# Patient Record
Sex: Female | Born: 1935 | Race: White | Hispanic: No | Smoking: Never smoker
Health system: Southern US, Community
[De-identification: ages and names within clinical notes are randomized; demographics above are authoritative.]

## PROBLEM LIST (undated history)

## (undated) DIAGNOSIS — J99 Respiratory disorders in diseases classified elsewhere: Secondary | ICD-10-CM

## (undated) DIAGNOSIS — J449 Chronic obstructive pulmonary disease, unspecified: Secondary | ICD-10-CM

## (undated) DIAGNOSIS — I350 Nonrheumatic aortic (valve) stenosis: Secondary | ICD-10-CM

## (undated) DIAGNOSIS — M069 Rheumatoid arthritis, unspecified: Secondary | ICD-10-CM

## (undated) DIAGNOSIS — J310 Chronic rhinitis: Secondary | ICD-10-CM

## (undated) DIAGNOSIS — M3500 Sicca syndrome, unspecified: Secondary | ICD-10-CM

## (undated) DIAGNOSIS — E785 Hyperlipidemia, unspecified: Secondary | ICD-10-CM

## (undated) DIAGNOSIS — R252 Cramp and spasm: Secondary | ICD-10-CM

## (undated) DIAGNOSIS — I10 Essential (primary) hypertension: Secondary | ICD-10-CM

## (undated) DIAGNOSIS — E854 Organ-limited amyloidosis: Secondary | ICD-10-CM

## (undated) HISTORY — DX: Essential (primary) hypertension: I10

## (undated) HISTORY — DX: Chronic obstructive pulmonary disease, unspecified: J44.9

## (undated) HISTORY — DX: Nonrheumatic aortic (valve) stenosis: I35.0

## (undated) HISTORY — DX: Sjogren syndrome, unspecified: M35.00

## (undated) HISTORY — DX: Organ-limited amyloidosis: E85.4

## (undated) HISTORY — DX: Rheumatoid arthritis, unspecified: M06.9

## (undated) HISTORY — DX: Respiratory disorders in diseases classified elsewhere: J99

## (undated) HISTORY — DX: Cramp and spasm: R25.2

## (undated) HISTORY — PX: TONSILLECTOMY: SUR1361

## (undated) HISTORY — DX: Chronic rhinitis: J31.0

## (undated) HISTORY — DX: Hyperlipidemia, unspecified: E78.5

## (undated) HISTORY — PX: TUBAL LIGATION: SHX77

---

## 1987-05-19 ENCOUNTER — Encounter: Payer: Self-pay | Admitting: Internal Medicine

## 1990-01-17 DIAGNOSIS — E854 Organ-limited amyloidosis: Secondary | ICD-10-CM

## 1990-01-17 DIAGNOSIS — J99 Respiratory disorders in diseases classified elsewhere: Secondary | ICD-10-CM

## 1990-01-17 HISTORY — DX: Respiratory disorders in diseases classified elsewhere: J99

## 1990-01-17 HISTORY — PX: LUNG BIOPSY: SHX232

## 1990-01-17 HISTORY — DX: Organ-limited amyloidosis: E85.4

## 2005-03-08 ENCOUNTER — Ambulatory Visit: Payer: Self-pay | Admitting: Unknown Physician Specialty

## 2005-03-22 ENCOUNTER — Ambulatory Visit: Payer: Self-pay | Admitting: Unknown Physician Specialty

## 2005-10-26 ENCOUNTER — Ambulatory Visit: Payer: Self-pay | Admitting: Unknown Physician Specialty

## 2005-11-14 ENCOUNTER — Ambulatory Visit: Payer: Self-pay | Admitting: Ophthalmology

## 2005-11-21 ENCOUNTER — Ambulatory Visit: Payer: Self-pay | Admitting: Ophthalmology

## 2005-12-30 ENCOUNTER — Emergency Department: Payer: Self-pay | Admitting: Emergency Medicine

## 2006-03-13 ENCOUNTER — Ambulatory Visit: Payer: Self-pay | Admitting: Unknown Physician Specialty

## 2007-03-16 ENCOUNTER — Ambulatory Visit: Payer: Self-pay | Admitting: Unknown Physician Specialty

## 2007-06-04 ENCOUNTER — Ambulatory Visit: Payer: Self-pay | Admitting: Ophthalmology

## 2007-06-04 ENCOUNTER — Other Ambulatory Visit: Payer: Self-pay

## 2007-06-18 ENCOUNTER — Ambulatory Visit: Payer: Self-pay | Admitting: Ophthalmology

## 2008-01-18 DIAGNOSIS — R252 Cramp and spasm: Secondary | ICD-10-CM

## 2008-01-18 HISTORY — DX: Cramp and spasm: R25.2

## 2008-04-03 ENCOUNTER — Ambulatory Visit: Payer: Self-pay | Admitting: Unknown Physician Specialty

## 2008-06-01 ENCOUNTER — Ambulatory Visit: Payer: Self-pay | Admitting: Cardiology

## 2008-06-01 ENCOUNTER — Inpatient Hospital Stay (HOSPITAL_COMMUNITY): Admission: EM | Admit: 2008-06-01 | Discharge: 2008-06-05 | Payer: Self-pay | Admitting: Emergency Medicine

## 2008-06-01 ENCOUNTER — Ambulatory Visit: Payer: Self-pay | Admitting: Internal Medicine

## 2008-06-02 ENCOUNTER — Encounter: Payer: Self-pay | Admitting: Infectious Disease

## 2008-06-03 ENCOUNTER — Ambulatory Visit: Payer: Self-pay | Admitting: Vascular Surgery

## 2008-06-03 ENCOUNTER — Encounter (INDEPENDENT_AMBULATORY_CARE_PROVIDER_SITE_OTHER): Payer: Self-pay | Admitting: Internal Medicine

## 2008-06-03 ENCOUNTER — Encounter: Payer: Self-pay | Admitting: Cardiology

## 2008-06-04 ENCOUNTER — Encounter: Payer: Self-pay | Admitting: Internal Medicine

## 2008-06-04 ENCOUNTER — Encounter: Payer: Self-pay | Admitting: Cardiology

## 2008-06-06 ENCOUNTER — Ambulatory Visit: Payer: Self-pay | Admitting: Pulmonary Disease

## 2008-06-06 ENCOUNTER — Telehealth (INDEPENDENT_AMBULATORY_CARE_PROVIDER_SITE_OTHER): Payer: Self-pay | Admitting: *Deleted

## 2008-06-06 DIAGNOSIS — I10 Essential (primary) hypertension: Secondary | ICD-10-CM

## 2008-06-06 DIAGNOSIS — J99 Respiratory disorders in diseases classified elsewhere: Secondary | ICD-10-CM

## 2008-06-06 DIAGNOSIS — J441 Chronic obstructive pulmonary disease with (acute) exacerbation: Secondary | ICD-10-CM | POA: Insufficient documentation

## 2008-06-06 DIAGNOSIS — E854 Organ-limited amyloidosis: Secondary | ICD-10-CM

## 2008-06-06 DIAGNOSIS — R0602 Shortness of breath: Secondary | ICD-10-CM

## 2008-06-06 DIAGNOSIS — J309 Allergic rhinitis, unspecified: Secondary | ICD-10-CM | POA: Insufficient documentation

## 2008-06-06 DIAGNOSIS — E785 Hyperlipidemia, unspecified: Secondary | ICD-10-CM

## 2008-06-13 ENCOUNTER — Ambulatory Visit: Payer: Self-pay | Admitting: Internal Medicine

## 2008-06-25 ENCOUNTER — Encounter: Payer: Self-pay | Admitting: Cardiology

## 2008-06-25 ENCOUNTER — Ambulatory Visit: Payer: Self-pay | Admitting: Cardiology

## 2008-06-25 DIAGNOSIS — I350 Nonrheumatic aortic (valve) stenosis: Secondary | ICD-10-CM

## 2008-07-28 ENCOUNTER — Telehealth (INDEPENDENT_AMBULATORY_CARE_PROVIDER_SITE_OTHER): Payer: Self-pay | Admitting: *Deleted

## 2008-07-28 ENCOUNTER — Ambulatory Visit: Payer: Self-pay | Admitting: Internal Medicine

## 2008-07-28 DIAGNOSIS — R252 Cramp and spasm: Secondary | ICD-10-CM

## 2008-07-31 ENCOUNTER — Encounter: Payer: Self-pay | Admitting: Internal Medicine

## 2008-08-14 ENCOUNTER — Ambulatory Visit: Payer: Self-pay | Admitting: Internal Medicine

## 2008-08-15 ENCOUNTER — Telehealth: Payer: Self-pay | Admitting: Adult Health

## 2008-08-19 ENCOUNTER — Ambulatory Visit: Payer: Self-pay | Admitting: Internal Medicine

## 2008-08-20 ENCOUNTER — Ambulatory Visit: Payer: Self-pay | Admitting: Cardiology

## 2008-08-25 LAB — CONVERTED CEMR LAB
CO2: 25 meq/L (ref 19–32)
Calcium: 9 mg/dL (ref 8.4–10.5)
Creatinine, Ser: 0.64 mg/dL (ref 0.40–1.20)
Pro B Natriuretic peptide (BNP): 117.6 pg/mL — ABNORMAL HIGH (ref 0.0–100.0)
Sodium: 132 meq/L — ABNORMAL LOW (ref 135–145)

## 2008-09-08 ENCOUNTER — Ambulatory Visit: Payer: Self-pay | Admitting: Internal Medicine

## 2008-10-08 ENCOUNTER — Telehealth: Payer: Self-pay | Admitting: Internal Medicine

## 2008-10-10 ENCOUNTER — Telehealth: Payer: Self-pay | Admitting: Internal Medicine

## 2008-11-10 ENCOUNTER — Ambulatory Visit: Payer: Self-pay | Admitting: Internal Medicine

## 2008-12-26 ENCOUNTER — Ambulatory Visit: Payer: Self-pay | Admitting: Cardiology

## 2009-04-07 ENCOUNTER — Ambulatory Visit: Payer: Self-pay | Admitting: Unknown Physician Specialty

## 2009-05-01 ENCOUNTER — Ambulatory Visit: Payer: Self-pay | Admitting: Internal Medicine

## 2009-06-23 ENCOUNTER — Ambulatory Visit: Payer: Self-pay

## 2009-06-23 ENCOUNTER — Encounter: Payer: Self-pay | Admitting: Cardiology

## 2009-08-10 ENCOUNTER — Ambulatory Visit: Payer: Self-pay | Admitting: Cardiology

## 2009-12-03 ENCOUNTER — Telehealth: Payer: Self-pay | Admitting: Internal Medicine

## 2010-01-25 ENCOUNTER — Ambulatory Visit
Admission: RE | Admit: 2010-01-25 | Discharge: 2010-01-25 | Payer: Self-pay | Source: Home / Self Care | Attending: Internal Medicine | Admitting: Internal Medicine

## 2010-02-16 NOTE — Assessment & Plan Note (Signed)
Summary: Denise Bell   Referring Provider:  Dr. Romeo Apple Primary Provider:  Dr. Einar Crow - PMd, Dr. Shirlee Latch - Cardiology  CC:  Follow up.  History of Present Illness: 77 yo with history of rheumatoid arthritis, localized pulmonary amyloidosis, Sjogren's syndrome, obstructive lung disease, and aortic stenosis presents in followup.   Patient was admitted to [facility] in 5/10 with pleuritic chest pain and shortness of breath likely due to bronchospasm.  Initial echo was read as showing "severe aortic stenosis" so cardiology became involved.  Re-interpretation of echo showed that aortic stenosis was mild to moderate.  She then had PFTs done, showing a severe obstructive defect responsive to bronchodilators.  The patient's presentation was thought to be due to bronchiolitis obliterans of RA versus asthma.  Patient was discharged from the hospital on a prednisone taper and inhalers.  She has been following up with Dr. Marchelle Gearing for her obstructive lung disease and localized pulmonary amyloidosis.   Patient has been stable recently.  BP is a bit elevated today but has been < 140/90 when she checks it at [employer].  She has had periodic bouts of bronchitis but currently is ok.  Repeat echo in 6/11 showed mild to moderate aortic stenosis.  No current exertional dyspnea or chest pain.   Labs (8/10): BNP 118, K 4.1, creatinine 0.6    Current Medications (verified): 1)  Aspirin Adult Low Strength 81 Mg Tbec (Aspirin) .... Take 1 Tablet By Mouth Once A Day 2)  Hydrochlorothiazide 25 Mg Tabs (Hydrochlorothiazide) .... Take 1 Tablet By Mouth Once A Day 3)  Synthroid 50 Mcg Tabs (Levothyroxine Sodium) .... Take 1 Tablet By Mouth Once A Day 4)  Ibuprofen 400 Mg Tabs (Ibuprofen) .... As Needed 5)  Eql Fish Oil 1000 Mg Caps (Omega-3 Fatty Acids) .... Take 1 Tablet By Mouth Two Times A Day 6)  Flutter  Devi (Respiratory Therapy Supplies) .... 3 Times Daily 7)  Symbicort 80-4.5 Mcg/act Aero (Budesonide-Formoterol  Fumarate) .... Inhale 2 Puffs Two Times A Day 8)  Ocuvite  Tabs (Multiple Vitamins-Minerals) .... Take 1 Tablet By Mouth Once A Day 9)  Calcium + D 600-200 Mg-Unit Tabs (Calcium Carbonate-Vitamin D) .... Take 1 Tablet By Mouth Two Times A Day 10)  Cvs Vitamin B12 100 Mcg Tabs (Cyanocobalamin) .Marland Kitchen.. 1 By Mouth Once Daily (Not Sure of Strength) 11)  Cvs Vitamin B-6 100 Mg Tabs (Pyridoxine Hcl) .Marland Kitchen.. 1 By Mouth Daily (Not Sure of Strength) 12)  Cvs Vitamin C 1000 Mg Tabs (Ascorbic Acid) .... 2 Tabets Daily  Allergies (verified): 1)  ! Pcn 2)  ! Sulfa 3)  ! * Aleve 4)  ! * Phenylpropanl 5)  ! Epinephrine 6)  ! * Avelox 7)  ! Flonase (Fluticasone Propionate) 8)  ! * Thimerosal  Past History:  Past Surgical History: Last updated: 08/20/2008 Lung biopsy Tubal ligation Tonsillectomy  Family History: Last updated: 12/18/2008 History Coronary Artery Disease   father: deceased at age 58 2 brothers: deceased at age 62 and 57  mother: deceased at age 72 2 sisters: both deceased in their 55's All above died from a heart attack  Social History: Last updated: 08/20/2008 Widowed Lives alone but several children live near.  Never Smoked Never ETOH known as hummingbird lady of [place] Regular Exercise - yes Drug Use - no  Risk Factors: Exercise: yes (08/20/2008)  Risk Factors: Smoking Status: never (06/06/2008)  Past Medical History: 1.  Rheumatoid arthritis 2.  Sjogren Syndrome 3.  Hypertension 4.  Dyslipidemia:  She  had very severe muscle soreness and weakness with use of Lipitor and will not take a statin again.  5.  Mild to moderate aortic stenosis:  Echo (5/10) with EF 55-65%, mild to moderate AS with mean gradient 19 mmHg, mild LVH.  Physical exam is also consistent with mild to moderate AS.  -  Initial echo 5/10 was read as showing "severe aortic stenosis" so cardiology became involved.   -  Echo (6/11) with mild to moderate aortic stenosis and normal systolic function  (no change).  Re-interpretation of echo showed that aortic stenosis was mild to moderate 6.  Localized Pulmonary Amyloidosis      - dx by lung bx at [facility]      - This type of amyloidosis is isolated and should not affect the heart.  7.  Obstructive lung disease in nonsmoker -      - new dx 05/2008, ? due to localized pulmonary amyloidosis      - PFTs 06/03/08 reported moderate obstruction with severe diffusion defect     - ? diffusion defect due to cysts/cavities of amyloidosis vs new asthma vs Bronchiolitis Obliterans of RA     - 2 week pred trial starting 06/03/2008     - Spirometry in office after prednisone trial:  FEV1 0.99L/99%, FVC 1.67L/101%. Ratio 59        - Spiro 07/28/2008 Fev1 0.87L/88%     - Cleda Daub 08/19/2008 Fev1 .96L/132%, FVC 1.54L/119%,     - SPiro 09/08/2008 - FEv1 1.02L/103%, FVC 1.52L/92%, Ratio 67 (69) 8.  Chronic Rhinitis # Leg cramps in 2010 > Unrelated to pulmonary inhalers.Marland Kitchentested and proven using Koch's postulate  Family History: Reviewed history from 12/18/2008 and no changes required. History Coronary Artery Disease   father: deceased at age 74 2 brothers: deceased at age 67 and 70  mother: deceased at age 44 2 sisters: both deceased in their 71's All above died from a heart attack  Social History: Reviewed history from 08/20/2008 and no changes required. Widowed Lives alone but several children live near.  Never Smoked Never ETOH known as hummingbird lady of [place] Regular Exercise - yes Drug Use - no  Vital Signs:  Patient profile:   77 year old female Height:      57 inches Weight:      108 pounds BMI:     23.46 Pulse rate:   83 / minute BP sitting:   152 / 88  (right arm) Cuff size:   regular  Vitals Entered By: Benedict Needy, RN (August 10, 2009 3:44 PM)  Physical Exam  General:  Well developed, well nourished, in no acute distress. Neck:  Neck supple, no JVD. No masses, thyromegaly or abnormal cervical nodes. Lungs:  decreased  breath sounds, no wheezing or rales Heart:  Non-displaced PMI, chest non-tender; regular rate and rhythm, S1, S2 without rubs or gallops. 2/6 mid systolic murmur RUSB.  Carotid upstroke normal, no bruit.  Pedals normal pulses. No edema, no varicosities. Abdomen:  Bowel sounds positive; abdomen soft and non-tender without masses, organomegaly, or hernias noted. No hepatosplenomegaly. Extremities:  No clubbing or cyanosis. Neurologic:  Alert and oriented x 3. Psych:  Normal affect.   Impression & Recommendations:  Problem # 1:  AORTIC STENOSIS (ICD-424.1) Mild to moderate on exam and echo, this has been stable.  No further workup necessary at this time.   Problem # 2:  HYPERTENSION (ICD-401.9) BP has been within normal limits at home.  Will make no changes.  Other Orders: EKG w/ Interpretation (93000)  Patient Instructions: 1)  Your physician wants you to follow-up in:   1 year You will receive a reminder letter in the mail two months in advance. If you don't receive a letter, please call our office to schedule the follow-up appointment.

## 2010-02-16 NOTE — Progress Notes (Signed)
Summary: talk to nurs  Phone Note Call from Patient   Caller: Patient Call For: Ritik Stavola Summary of Call: pt calling about getting new flutter ( green bottle for breathing treatment) Initial call taken by: Rickard Patience,  December 03, 2009 9:48 AM  Follow-up for Phone Call        [email or web address].Carron Curie CMA  December 03, 2009 10:30 AM  pt staets seh needs a new flutter valve. i advised one left at fron for her.Carron Curie CMA  December 03, 2009 10:36 AM

## 2010-02-16 NOTE — Assessment & Plan Note (Signed)
Summary: 6 months/apc   Visit Type:  Follow-up Copy to:  Dr. Romeo Apple Primary Provider/Referring Provider:  Dr. Einar Crow - PMd, Dr. Shirlee Latch - Cardiology  CC:  Pt here for 6 month follow-up. Pt has no new complaints at this time. Marland Kitchen  History of Present Illness: 77 yo with history of rheumatoid arthritis, localized pulmonary amyloidosis, Sjogren's syndrome, obstructive lung disease, and aortic stenosis presents in followup.   Patient was admitted to [facility] in 5/10 with pleuritic chest pain and shortness of breath likely due to bronchospasm. .  She then had PFTs done, showing a severe obstructive defect responsive to bronchodilators.  The patient's presentation was thought to be due to bronchiolitis obliterans of RA versus asthma.    05/01/2009: Followup leg crampos, dyspnea, gold stage 2 copd. Dyspnea is nearly resolved/stable. WEll controlled. Takes symbicort 1 puff bid.  No new symptoms. No wheeze. Feels good from lung health perspective. Wants to cut down symbicort to 1 puff daily (trial of this in July 2010 casues recurrent symptoms). Of note, she has had intolerance to spiriva - chest tightnes. Leg cramps continue. Being taken care of by PMd. No new symptoms.   Allergies (verified): 1)  ! Pcn 2)  ! Sulfa 3)  ! * Aleve 4)  ! * Phenylpropanl 5)  ! Epinephrine 6)  ! * Avelox 7)  ! Flonase (Fluticasone Propionate) 8)  ! * Thimerosal  Past History:  Family History: Last updated: 12/18/2008 History Coronary Artery Disease   father: deceased at age 82 2 brothers: deceased at age 30 and 23  mother: deceased at age 90 2 sisters: both deceased in their 75's All above died from a heart attack  Social History: Last updated: 08/20/2008 Widowed Lives alone but several children live near.  Never Smoked Never ETOH known as hummingbird lady of [place] Regular Exercise - yes Drug Use - no  Risk Factors: Exercise: yes (08/20/2008)  Risk Factors: Smoking Status: never  (06/06/2008)  Past Medical History: 1.  Rheumatoid arthritis 2.  Sjogren Syndrome 3.  Hypertension 4.  Dyslipidemia:  She had very severe muscle soreness and weakness with use of Lipitor and will not take a statin again.  5.  Mild to moderate aortic stenosis:  Echo (5/10) with EF 55-65%, mild to moderate AS with mean gradient 19 mmHg, mild LVH.  Physical exam is also consistent with mild to moderate AS.  >Seen by Dr Morene Antu cards June 2010 >  Initial echo 5/10 was read as showing "severe aortic stenosis" so cardiology became involved.  Re-interpretation of echo showed that aortic stenosis was mild to moderate 6.  Localized Pulmonary Amyloidosis      - dx by lung bx at [facility]      - This type of amyloidosis is isolated and should not affect the heart.  7.  Obstructive lung disease in nonsmoker -      - new dx 05/2008, ? due to localized pulmonary amyloidosis      - PFTs 06/03/08 reported moderate obstruction with severe diffusion defect     - ? diffusion defect due to cysts/cavities of amyloidosis vs new asthma vs Bronchiolitis Obliterans of RA     - 2 week pred trial starting 06/03/2008     - Spirometry in office after prednisone trial:  FEV1 0.99L/99%, FVC 1.67L/101%. Ratio 59        - Spiro 07/28/2008 Fev1 0.87L/88%     - Cleda Daub 08/19/2008 Fev1 .96L/132%, FVC 1.54L/119%,     -  SPiro 09/08/2008 - FEv1 1.02L/103%, FVC 1.52L/92%, Ratio 67 (69) 8.  Chronic Rhinitis # Leg cramps in 2010 > Unrelated to pulmonary inhalers.Marland Kitchentested and proven using Koch's postulate  Past Surgical History: Reviewed history from 08/20/2008 and no changes required. Lung biopsy Tubal ligation Tonsillectomy  Family History: Reviewed history from 12/18/2008 and no changes required. History Coronary Artery Disease   father: deceased at age 24 2 brothers: deceased at age 62 and 61  mother: deceased at age 68 2 sisters: both deceased in their 25's All above died from a heart attack  Social  History: Reviewed history from 08/20/2008 and no changes required. Widowed Lives alone but several children live near.  Never Smoked Never ETOH known as hummingbird lady of [place] Regular Exercise - yes Drug Use - no  Review of Systems  The patient denies shortness of breath with activity, shortness of breath at rest, productive cough, non-productive cough, coughing up blood, chest pain, irregular heartbeats, acid heartburn, indigestion, loss of appetite, weight change, abdominal pain, difficulty swallowing, sore throat, tooth/dental problems, headaches, nasal congestion/difficulty breathing through nose, sneezing, itching, ear ache, anxiety, depression, hand/feet swelling, joint stiffness or pain, rash, change in color of mucus, and fever.    Vital Signs:  Patient profile:   77 year old female Height:      57 inches Weight:      113.13 pounds O2 Sat:      98 % on Room air Temp:     98.0 degrees F oral Pulse rate:   79 / minute BP sitting:   140 / 80  (right arm) Cuff size:   regular  Vitals Entered By: Carron Curie CMA (May 01, 2009 10:11 AM)  O2 Flow:  Room air CC: Pt here for 6 month follow-up. Pt has no new complaints at this time.  Comments Medications reviewed with patient   Carron Curie CMA  May 01, 2009 10:14 AM Daytime phone number verified with patient.    Physical Exam  General:  normal appearance.   Head:  normocephalic and atraumatic Eyes:  PERRLA/EOM intact; conjunctiva and sclera clear Nose:  clear nasal discharge, no sinus tenderness Mouth:  mild erythema posterior pharynx Neck:  no JVP Lungs:  decreased breath sounds, no wheezing or rales Heart:  regular rhythm and normal rate with 2/6 SM Abdomen:  soft, nontender, no masses, normal bowel sounds Pulses:  pulses normal Extremities:  no edema Skin:  left arm bruised from IV during recetn hospitalization Cervical Nodes:  no significant adenopathy Psych:  alert and cooperative; normal  mood and affect; normal attention span and concentration   Impression & Recommendations:  Problem # 1:  CHRONIC AIRWAY OBSTRUCTION NEC (ICD-496) Assessment Improved am not recommending you cut down your symbicort becuase when you did this in July 2010 your wheeze got worse. But because you are doing well now, I am ok with your plan to cut down symbicort to 1 puff daily.  Keep any eye on your symptoms. If they get worse call us or visit [place].   Return in 9 months or sooner if any concerns  Make sure you get flu shot in fall  I am happy you are doing well overall  Other Orders: Est. Patient Level II (40981) Prescription Created Electronically [phone])  Patient Instructions: 1)  I am not recommending you cut down your symbicort becuase when you did this in July 2010 your wheeze got worse. But because you are doing well now, I am ok with your plan to  cut down symbicort to 1 puff daily. 2)  Keep any eye on your symptoms. If they get worse call us or visit [place].  3)  Return in 9 months or sooner if any concerns 4)  Make sure you get flu shot in fall 5)  I am happy you are doing well overall Prescriptions: SYMBICORT 80-4.5 MCG/ACT AERO (BUDESONIDE-FORMOTEROL FUMARATE) Inhale 2 puffs two times a day  #1 x 9   Entered and Authorized by:   Kalman Shan MD   Signed by:   Kalman Shan MD on 05/01/2009   Method used:   Electronically to        [facility] [employer] [phone]* (retail)       [address] [place], [place]  [address]       Ph: [phone] or [phone]       Fax: [phone]   RxID:   [id number]    Immunization History:  Influenza Immunization History:    Influenza:  fluvax 3+ (10/17/2008)  Pneumovax Immunization History:    Pneumovax:  pneumovax (10/18/2006)

## 2010-02-18 NOTE — Assessment & Plan Note (Signed)
Summary: ROV ///KP   Visit Type:  Follow-up Copy to:  Dr. Romeo Apple Primary Provider/Referring Provider:  Dr. Einar Crow - PMd, Dr. Shirlee Latch - Cardiology  CC:  Pt here for follow-up. Pt state ssh eis using symbicort 2 puffs at night. .  History of Present Illness: 77 yo with history of rheumatoid arthritis, localized pulmonary amyloidosis, Sjogren's syndrome, obstructive lung disease, and aortic stenosis presents in followup.   Patient was admitted to [facility] in 5/10 with pleuritic chest pain and shortness of breath likely due to bronchospasm. .  She then had PFTs done, showing a severe obstructive defect responsive to bronchodilators.  The patient's presentation was thought to be due to bronchiolitis obliterans of RA versus asthma.    05/01/2009: Followup leg crampos, dyspnea, gold stage 2 copd. Dyspnea is nearly resolved/stable. WEll controlled. Takes symbicort 1 puff bid.  No new symptoms. No wheeze. Feels good from lung health perspective. Wants to cut down symbicort to 1 puff daily (trial of this in July 2010 casues recurrent symptoms). Of note, she has had intolerance to spiriva - chest tightnes. Leg cramps continue. Being taken care of by PMd. No new symptoms. REC: CONTNUE SYMBICORT   January 25, 2010: Followup localized pulm amyloid/asthma - copd. Doing well. Several months ago cut down symbicort to  1 puff dialy. No deterioration. She wants to eliminate drug completely now. Good appetitei. Maintaing wegith. Very functional. No wheeze, dyspnea, chest pain, edema, cough, fever, chills, weight loss.    Preventive Screening-Counseling & Management  Alcohol-Tobacco     Smoking Status: never  Current Medications (verified): 1)  Aspirin Adult Low Strength 81 Mg Tbec (Aspirin) .... Take 1 Tablet By Mouth Once A Day 2)  Hydrochlorothiazide 25 Mg Tabs (Hydrochlorothiazide) .... Take 1 Tablet By Mouth Once A Day 3)  Synthroid 50 Mcg Tabs (Levothyroxine Sodium) .... Take 1 Tablet By Mouth  Once A Day 4)  Ibuprofen 400 Mg Tabs (Ibuprofen) .... As Needed 5)  Eql Fish Oil 1000 Mg Caps (Omega-3 Fatty Acids) .... Take 1 Tablet By Mouth Two Times A Day 6)  Flutter  Devi (Respiratory Therapy Supplies) .... 3 Times Daily 7)  Symbicort 80-4.5 Mcg/act Aero (Budesonide-Formoterol Fumarate) .... Inhale 2 Puffs Two Times A Day 8)  Ocuvite  Tabs (Multiple Vitamins-Minerals) .... Take 1 Tablet By Mouth Once A Day 9)  Calcium + D 600-200 Mg-Unit Tabs (Calcium Carbonate-Vitamin D) .... Take 1 Tablet By Mouth Two Times A Day 10)  Cvs Vitamin B12 100 Mcg Tabs (Cyanocobalamin) .Marland Kitchen.. 1 By Mouth Once Daily (Not Sure of Strength) 11)  Cvs Vitamin B-6 100 Mg Tabs (Pyridoxine Hcl) .Marland Kitchen.. 1 By Mouth Daily (Not Sure of Strength) 12)  Cvs Vitamin C 1000 Mg Tabs (Ascorbic Acid) .... 2 Tabets Daily 13)  Ventolin Hfa 108 (90 Base) Mcg/act Aers (Albuterol Sulfate) .... 2 Puffs Every 4-6 Hrs As Needed  Allergies (verified): 1)  ! Pcn 2)  ! Sulfa 3)  ! * Aleve 4)  ! * Phenylpropanl 5)  ! Epinephrine 6)  ! * Avelox 7)  ! Flonase (Fluticasone Propionate) 8)  ! * Thimerosal  Past History:  Past medical, surgical, family and social histories (including risk factors) reviewed, and no changes noted (except as noted below).  Past Medical History: Reviewed history from 08/10/2009 and no changes required. 1.  Rheumatoid arthritis 2.  Sjogren Syndrome 3.  Hypertension 4.  Dyslipidemia:  She had very severe muscle soreness and weakness with use of Lipitor and will not take  a statin again.  5.  Mild to moderate aortic stenosis:  Echo (5/10) with EF 55-65%, mild to moderate AS with mean gradient 19 mmHg, mild LVH.  Physical exam is also consistent with mild to moderate AS.  -  Initial echo 5/10 was read as showing "severe aortic stenosis" so cardiology became involved.   -  Echo (6/11) with mild to moderate aortic stenosis and normal systolic function (no change).  Re-interpretation of echo showed that aortic  stenosis was mild to moderate 6.  Localized Pulmonary Amyloidosis      - dx by lung bx at [facility]      - This type of amyloidosis is isolated and should not affect the heart.  7.  Obstructive lung disease in nonsmoker -      - new dx 05/2008, ? due to localized pulmonary amyloidosis      - PFTs 06/03/08 reported moderate obstruction with severe diffusion defect     - ? diffusion defect due to cysts/cavities of amyloidosis vs new asthma vs Bronchiolitis Obliterans of RA     - 2 week pred trial starting 06/03/2008     - Spirometry in office after prednisone trial:  FEV1 0.99L/99%, FVC 1.67L/101%. Ratio 59        - Spiro 07/28/2008 Fev1 0.87L/88%     - Cleda Daub 08/19/2008 Fev1 .96L/132%, FVC 1.54L/119%,     - SPiro 09/08/2008 - FEv1 1.02L/103%, FVC 1.52L/92%, Ratio 67 (69) 8.  Chronic Rhinitis # Leg cramps in 2010 > Unrelated to pulmonary inhalers.Marland Kitchentested and proven using Koch's postulate  Past Surgical History: Reviewed history from 08/20/2008 and no changes required. Lung biopsy Tubal ligation Tonsillectomy  Family History: Reviewed history from 12/18/2008 and no changes required. History Coronary Artery Disease   father: deceased at age 11 2 brothers: deceased at age 6 and 94  mother: deceased at age 73 2 sisters: both deceased in their 58's All above died from a heart attack  Social History: Reviewed history from 08/20/2008 and no changes required. Widowed Lives alone but several children live near.  Never Smoked Never ETOH known as hummingbird lady of [place] Regular Exercise - yes Drug Use - no  Review of Systems       The patient complains of productive cough and nasal congestion/difficulty breathing through nose.  The patient denies shortness of breath with activity, shortness of breath at rest, non-productive cough, coughing up blood, chest pain, irregular heartbeats, acid heartburn, indigestion, loss of appetite, weight change, abdominal pain, difficulty swallowing,  sore throat, tooth/dental problems, headaches, sneezing, itching, ear ache, anxiety, depression, hand/feet swelling, joint stiffness or pain, rash, change in color of mucus, and fever.    Vital Signs:  Patient profile:   77 year old female Height:      57 inches Weight:      111 pounds BMI:     24.11 O2 Sat:      94 % on Room air Temp:     98.1 degrees F oral Pulse rate:   97 / minute BP sitting:   132 / 70  (right arm) Cuff size:   regular  Vitals Entered By: Carron Curie CMA (January 25, 2010 2:35 PM)  O2 Flow:  Room air CC: Pt here for follow-up. Pt state ssh eis using symbicort 2 puffs at night.  Comments Medications reviewed with patient Carron Curie CMA  January 25, 2010 2:35 PM Daytime phone number verified with patient.    Physical Exam  General:  normal appearance.   Head:  normocephalic and atraumatic Eyes:  PERRLA/EOM intact; conjunctiva and sclera clear Nose:  clear nasal discharge, no sinus tenderness Mouth:  mild erythema posterior pharynx Neck:  no JVP Lungs:  decreased breath sounds, no wheezing or rales Heart:  regular rhythm and normal rate with 2/6 SM Abdomen:  soft, nontender, no masses, normal bowel sounds Pulses:  pulses normal Extremities:  no edema Skin:  left arm bruised from IV during recetn hospitalization Cervical Nodes:  no significant adenopathy Psych:  alert and cooperative; normal mood and affect; normal attention span and concentration   Impression & Recommendations:  Problem # 1:  CHRONIC AIRWAY OBSTRUCTION NEC (ICD-496) Assessment Improved  am not recommending you eliminate  your symbicort becuase when you did this in July 2010 your wheeze got worse. But because you are doing well now, I am ok with your plan to continue symbicort to 1 puff daily atleast through june 2012 before trying to elimitate  Keep any eye on your symptoms. If they get worse call us or visit [place].   Return in 12 months  I am happy you are doing well  overall  Other Orders: Est. Patient Level II (16109) Prescription Created Electronically [phone])  Patient Instructions: 1)  glad you are doing better 2)  please continue symbicort 1 puff daily atleast through end may  3)  return in 1 year 4)  please take sample of symbicort 5)  come sooner if there are problems Prescriptions: SYMBICORT 80-4.5 MCG/ACT AERO (BUDESONIDE-FORMOTEROL FUMARATE) Inhale 2 puffs two times a day  #3 x 4   Entered and Authorized by:   Kalman Shan MD   Signed by:   Kalman Shan MD on 01/25/2010   Method used:   [facility] to        [facility] [employer] [phone]* (retail)       [address] [place], [place]  [address]       Ph: [phone] or [phone]       Fax: [phone]   RxID:   [id number]

## 2010-04-15 ENCOUNTER — Ambulatory Visit: Payer: Self-pay | Admitting: Unknown Physician Specialty

## 2010-04-27 LAB — CARDIAC PANEL(CRET KIN+CKTOT+MB+TROPI)
CK, MB: 3 ng/mL (ref 0.3–4.0)
Total CK: 63 U/L (ref 7–177)
Total CK: 82 U/L (ref 7–177)

## 2010-04-27 LAB — CBC
HCT: 35.8 % — ABNORMAL LOW (ref 36.0–46.0)
HCT: 36.8 % (ref 36.0–46.0)
HCT: 39.4 % (ref 36.0–46.0)
Hemoglobin: 12 g/dL (ref 12.0–15.0)
Hemoglobin: 12.1 g/dL (ref 12.0–15.0)
Hemoglobin: 12.6 g/dL (ref 12.0–15.0)
MCHC: 34.2 g/dL (ref 30.0–36.0)
MCV: 81.9 fL (ref 78.0–100.0)
Platelets: 262 10*3/uL (ref 150–400)
Platelets: 325 10*3/uL (ref 150–400)
RBC: 4.55 MIL/uL (ref 3.87–5.11)
RDW: 14 % (ref 11.5–15.5)
RDW: 14.1 % (ref 11.5–15.5)
RDW: 14.4 % (ref 11.5–15.5)
WBC: 7.1 10*3/uL (ref 4.0–10.5)

## 2010-04-27 LAB — SEDIMENTATION RATE: Sed Rate: 14 mm/hr (ref 0–22)

## 2010-04-27 LAB — POCT CARDIAC MARKERS
CKMB, poc: 1.3 ng/mL (ref 1.0–8.0)
Troponin i, poc: <0.05 ng/mL (ref 0.00–0.09)

## 2010-04-27 LAB — BASIC METABOLIC PANEL
BUN: 8 mg/dL (ref 6–23)
BUN: 9 mg/dL (ref 6–23)
CO2: 31 mEq/L (ref 19–32)
Calcium: 8.9 mg/dL (ref 8.4–10.5)
Calcium: 9.1 mg/dL (ref 8.4–10.5)
Chloride: 97 mEq/L (ref 96–112)
Creatinine, Ser: 0.64 mg/dL (ref 0.4–1.2)
GFR calc non Af Amer: >60 mL/min (ref >60)
GFR calc non Af Amer: >60 mL/min (ref >60)
Glucose, Bld: 85 mg/dL (ref 70–99)
Glucose, Bld: 88 mg/dL (ref 70–99)
Glucose, Bld: 97 mg/dL (ref 70–99)
Potassium: 4 mEq/L (ref 3.5–5.1)
Sodium: 133 mEq/L — ABNORMAL LOW (ref 135–145)
Sodium: 133 mEq/L — ABNORMAL LOW (ref 135–145)
Sodium: 136 mEq/L (ref 135–145)

## 2010-04-27 LAB — BLOOD GAS, ARTERIAL
Acid-Base Excess: 0.4 mmol/L (ref 0.0–2.0)
O2 Saturation: 95.4 %
TCO2: 25.1 mmol/L (ref 0–100)
pCO2 arterial: 35.4 mmHg (ref 35.0–45.0)
pO2, Arterial: 71.7 mmHg — ABNORMAL LOW (ref 80.0–100.0)

## 2010-04-27 LAB — URINALYSIS, ROUTINE W REFLEX MICROSCOPIC
Nitrite: NEGATIVE
Specific Gravity, Urine: 1.006 (ref 1.005–1.030)
Urobilinogen, UA: 0.2 mg/dL (ref 0.0–1.0)
pH: 6.5 (ref 5.0–8.0)

## 2010-04-27 LAB — COMPREHENSIVE METABOLIC PANEL
AST: 23 U/L (ref 0–37)
Albumin: 3.5 g/dL (ref 3.5–5.2)
BUN: 9 mg/dL (ref 6–23)
Chloride: 94 mEq/L — ABNORMAL LOW (ref 96–112)
Creatinine, Ser: 0.6 mg/dL (ref 0.4–1.2)
GFR calc Af Amer: >60 mL/min (ref >60)
Potassium: 4 mEq/L (ref 3.5–5.1)
Total Protein: 6.9 g/dL (ref 6.0–8.3)

## 2010-04-27 LAB — DIFFERENTIAL
Eosinophils Relative: 1 % (ref 0–5)
Lymphocytes Relative: 34 % (ref 12–46)
Monocytes Absolute: 0.5 10*3/uL (ref 0.1–1.0)
Monocytes Relative: 7 % (ref 3–12)
Neutro Abs: 4.1 10*3/uL (ref 1.7–7.7)

## 2010-04-27 LAB — PROTIME-INR: INR: 1 (ref 0.00–1.49)

## 2010-04-27 LAB — CK TOTAL AND CKMB (NOT AT ARMC)
Relative Index: INVALID (ref 0.0–2.5)
Total CK: 47 U/L (ref 7–177)

## 2010-04-27 LAB — HEMOCCULT GUIAC POC 1CARD (OFFICE): Fecal Occult Bld: NEGATIVE

## 2010-04-27 LAB — BRAIN NATRIURETIC PEPTIDE: Pro B Natriuretic peptide (BNP): 165 pg/mL — ABNORMAL HIGH (ref 0.0–100.0)

## 2010-06-01 NOTE — H&P (Signed)
NAMERIAH, KEHOE NO.:  [id number]   MEDICAL RECORD NO.:  [id number]          PATIENT TYPE:  INP   LOCATION:  1826                         FACILITY:  [facility]   PHYSICIAN:  Acey Lav, MD  DATE OF BIRTH:  05-26-1919   DATE OF ADMISSION:  06/01/2008  DATE OF DISCHARGE:                              HISTORY & PHYSICAL   CHIEF COMPLAINT:  Pleuritic chest pain and tightness.   HISTORY OF PRESENT ILLNESS:  Denise [name] is an 77 year old lady with past  medical history significant for rheumatoid arthritis, Sjogren's  syndrome, with pulmonary amyloidosis discovered in 1989 on biopsy at  [facility].  She has been without any pulmonary  symptoms since the 1980s.  She does suffer with some dry eyes for which  she uses artificial tears.  She also has had chronic arthritic pains  which she takes 1 daily aspirin for.  She has never been on any disease-  modifying agents for rheumatoid arthritis, nor has she been on any  steroids.  She presents with symptoms approximately 2 weeks ago of post-  nasal drip and pleuritic chest pain.  These symptoms have persisted.  She has seen her primary care doctor twice.  He prescribed azithromycin  and a Symbicort inhaler which failed to improve her symptoms.  She saw  an ENT doctor who did an endoscopy of her sinuses and found no evidence  of sinusitis whatsoever.  She continued to have pleuritic-type chest  pain and came to the emergency room today for evaluation.  She denies  nausea.  She denies diaphoresis.  Her pain does not radiate anywhere, it  is simply with deep breathing.  In the emergency department, she had a  chest x-ray done which showed multiple calcified nodules throughout both  lungs with calcified pleural parenchymal opacity in the left lower lobe.  She had a CT angiogram done which showed no evidence of pulmonary  embolism, but multiple bilateral calcified pulmonary nodules with  associated cystic  cavities.  These were found by radiology to be  consistent with parenchymal nodular amyloidosis.  Her cardiac enzymes  were negative.  Her EKG showed a normal sinus rhythm with no acute ST or  T-wave changes.  The rest of her labs were unremarkable.   PAST MEDICAL HISTORY:  1. Amyloidosis diagnosed in 1989 and biopsied at [facility] when at that      time cancer had been suspected by her doctor in [place].  2. Rheumatoid arthritis as described above, never been on disease-      modifying agents.  3. Sjogren's syndrome.  4. Hypertension.  5. Hyperlipidemia.   PAST SURGICAL HISTORY:  1. Biopsy as described above.  2. She also had tubal ligation done remotely.   FAMILY HISTORY:  She has 2 family members with early coronary disease,  her son who had it in his 40s and another son who had it in his 65s.   SOCIAL HISTORY:  Patient is a nonsmoker, nondrinker.  She lives alone  and manages all of her activities of daily living and independent  activities of daily  living by herself.   REVIEW OF SYSTEMS:  As described above in history of present illness.  Otherwise, 10-point review of systems is negative.   ALLERGIES:  1. STATINS induce myositis.  2. PENICILLIN  induced anaphylaxis and this was apparently documented      at [facility] after she was given a penicillin, according to the patient.   CURRENT MEDICATIONS:  1. Symbicort unknown dosage.  2. Multivitamin.  3. Synthroid unknown dosage.  4. Hydrochlorothiazide 25 mg.  5. Aspirin 500 mg daily.   PHYSICAL EXAMINATION:  Blood pressure currently is 127/60, pulse ,  respirations 21, pulse oximetry 99 to 100% on room air, temperature  maximum 98.4.  GENERAL:  Quite pleasant lady in no acute distress.  HEENT:  Normocephalic, atraumatic.  Her pupils are equal, round and  reactive to light.  Sclerae anicteric.  Oropharynx clear.  NECK:  Supple.  CARDIOVASCULAR:  A 2/6 systolic murmur at the right upper sternal border  with radiation to the  left carotid.  LUNGS:  Fairly clear to auscultation bilaterally without wheezes,  rhonchi or rales.  ABDOMEN:  Soft, nondistended, nontender.  EXTREMITIES:  Without edema.   Family history and musculoskeletal exam in her arm showed ulnar  deviation and PIP prominence, although she had no PIP tenderness.   LABORATORY DATA:  EKG and CT scan and chest x-ray as described above.  A  UA negative.  B-type natriuretic peptide negative.  Comprehensive  metabolic panel normal other than a slightly depressed sodium of 133.  CBC was completely normal.   ASSESSMENT AND PLAN:  This is an 77 year old lady with known rheumatoid  arthritis and pulmonary amyloidosis diagnosed in 1989 with onset of  pleuritic-type chest discomfort, now found to have multiple pulmonary  nodules with calcification and associated cystic cavities, felt by  radiology to be consistent with a parenchymal nodular amyloidosis.   1. Chest pain, pleuritic-type:  This is undoubtedly due to her      bilateral calcified pulmonary nodules and not due to coronary      disease.  Pulmonary embolism had already been excluded.  I will,      for thoroughness' sake, recycle her cardiac enzymes overnight      although she would be unlikely to have positive enzymes after      having 2 weeks of symptoms.  She has already had aspirin today.  I      have talked to Dr. Sung Amabile about the pulmonary nodules (see below.)  2. Pulmonary nodules:  I have done a cursory review of literature and      there is apparently no evidence for benefit for corticosteroids in      multiple nodular amyloidosis of the lungs.  One of the articles I      was reading was mentioning that having multiple lesions in the      lungs that were similar to what this patient had did portend a poor      prognosis, although this is a very rare disease.  I am going to      give her some ibuprofen overnight and one of the [facility] pulmonary      physicians in to see the patient in  the morning.  She will need      followup with a pulmonary doctor either here in [place] or      [place], or she could potentially follow up at [facility] where she      had the initial biopsy.  3.  Hypertension:  Continue her on her hydrochlorothiazide.  4. Hypothyroidism:  I would prefer to clarify her home dose of      Synthroid and then we can restart her on this.  5. Prophylaxis:  Patient will be on heparin t.i.d.  6. Code:  Patient is a full code.   DISPOSITION:  Patient should be likely discharged tomorrow since there  does not seem to be much need for interventions in the hospital.  I  doubt very seriously that she has any coronary disease.  I do not think  she really needs risk stratification, other than the cycling cardiac  enzymes.      Acey Lav, MD  Electronically Signed     CV/MEDQ  D:  06/01/2008  T:  06/01/2008  Job:  [id number]   cc:   Mayra Reel, Dr.  Einar Crow, Dr.

## 2010-06-01 NOTE — Consult Note (Signed)
Denise [name], Denise [name]              ACCOUNT NO.:  [id number]   MEDICAL RECORD NO.:  [id number]          PATIENT TYPE:  INP   LOCATION:  3705                         FACILITY:  [facility]   PHYSICIAN:  Kalman Shan, MD   DATE OF BIRTH:  07/11/1919   DATE OF CONSULTATION:  06/03/2008  DATE OF DISCHARGE:                                 CONSULTATION   MD REQUESTING CONSULT:  Dr. Sherrie Mustache of [facility].   REASON FOR CONSULTATION:  Pulmonary amyloidosis.   CHIEF COMPLAINT:  Chest tightness.   HISTORY OF PRESENT ILLNESS:  This is an 77 year old female who was  diagnosed with pulmonary amyloidosis in 1989 at [facility] by biopsy.  She has  had no respiratory symptoms since diagnosis.  She does have rheumatoid  arthritis but has not been on steroids nor has she been on any disease-  modifying agent for her rheumatoid arthritis.  She was in her usual  state of excellent health until approximately 2 weeks ago when she  developed sinus congestion, post nasal drip and pleuritic chest pain and  tightness.  She was unable to take a deep breath.  The chest tightness  was more tightness than pain, it was mid-chest, constant, not brought on  by exertion, very mild and there was no radiation of the pain.  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 On admission, the  medical team did rule out any cardiac origin of her chest pain, cardiac  enzymes were negative and she was negative for PE.  She denies any  associated fever, chills, hemoptysis, shortness of breath, dyspnea on  exertion, orthopnea or PND.  She does have some mild cough with clear  sputum associated with her post nasal drip.  She denies edema, leg or  calf pain, recent travel or antibiotics other than a recent prescription  for azithromycin or questionable sinusitis associated with her sinus  congestion.   REVIEW OF SYSTEMS:  Complete review of  systems was performed and was  negative except for what is indicated in the HPI.   ALLERGIES:  She is allergic to:  1. PENICILLIN.  2. STATINS.   PAST MEDICAL HISTORY:  1. Rheumatoid arthritis.  2. Amyloidosis.  3. Sjogren's syndrome.  4. Hypertension.   SOCIAL HISTORY:  The patient has never been a smoker.  She has never had  any exposure to secondhand smoke.  She does not drink alcohol, no  illicit drug use.  She lives alone and is independent with all  activities.   FAMILY HISTORY:  She has a family history of coronary artery disease but  no known pulmonary history.   HOME MEDICATIONS:  Symbicort, multivitamin, Synthroid unknown dose,  hydrochlorothiazide 25 mg daily, aspirin.   VITAL SIGNS:  Temperature 96.7, heart rate 67, respiratory rate 16,  blood pressure 97/61, O2 saturations 98% on room air.   RADIOLOGY DATA:  A CT scan of the chest shows no acute findings,  negative for PE.  Multiple bilateral calcified pulmonary nodules with  associated cystic cavities.  Consistent with parenchymal nodular  amyloidosis.   LABORATORY DATA:  Complete metabolic panel:  Hemoglobin 12.0, hematocrit  35.1, white blood cell 6.6, platelets 262.  Basic metabolic panel:  Sodium 133, potassium 3.8, BUN 8, creatinine 0.67, glucose 85.  Magnesium is 2.3, TSH 0.678, C-reactive protein is 0.1, ESR is 14, BNP  165.   Echocardiogram:  Please see full report of echocardiogram.  EF showed  55% to 65%.  Echocardiogram also showed severe aortic stenosis.   PHYSICAL EXAMINATION:  GENERAL:  This is a well-developed, well-  nourished female in no acute distress.  She appears much less than her  stated age of 49.  NEURO:  Alert and oriented x3, moves all extremities.  HEENT:  Mucous membranes moist, no JVD, no lymphadenopathy.  LUNGS:  Diminished throughout, no wheezes, rales or rhonchi.  BREASTS:  Even and unlabored.  CARDIAC:  S1-S2, regular rate and rhythm.  No murmurs, rubs or gallops.   ABDOMEN:  Soft, nontender, nondistended.  Positive bowel sounds.  EXTREMITIES:  Warm and dry, no edema.  Positive bilateral pedal pulses  palpable.  Bilateral upper extremities with some rheumatoid arthritis  changes.   IMPRESSION AND PLAN:  Pulmonary nodules.  These are likely consistent  with parenchymal nodular amyloidosis which the patient has a known  history of.  However, the chest tightness is likely unrelated to her  amyloidosis, this is usually an asymptomatic disease.  She will  certainly need further outpatient follow-up; however, she does not  appear to have any acute needs.  Her cough is likely related to her  postnasal drip and we will begin some Nasonex.  We will go ahead and get  a full pulmonary function test, while she is inpatient, to exclude any  new development of asthma or chronic obstructive pulmonary disease or  Rheumatoid Arthritis related bronchiolitis obliterans.  We will obtain  her old CT results from 1989, if able, to compare and see if there have  been any changes in the pulmonary nodules and determine if they have  enlarged at all.  Question whether this could be an atypical  presentation of symptoms of severe aortic stenosis which showed on her  2D echocardiogram, unsure if medicine is planning to get a cardiology  consult for this.  Again, this is a very rare disease, no specific  interventions have been found in literature to be very helpful.  We will  continue the Symbicort, which the patient feels is helpful, and again  will follow pulmonary function tests while she is in the hospital.      Dirk Dress, NP      Kalman Shan, MD  Electronically Signed    KW/MEDQ  D:  06/03/2008  T:  06/03/2008  Job:  [id number]

## 2010-06-01 NOTE — Discharge Summary (Signed)
Denise [name], [name] NO.:  [id number]   MEDICAL RECORD NO.:  [id number]          PATIENT TYPE:  INP   LOCATION:  3705                         FACILITY:  [facility]   PHYSICIAN:  Isidor Holts, M.D.  DATE OF BIRTH:  [birth year]   DATE OF ADMISSION:  06/01/2008  DATE OF DISCHARGE:  06/05/2008                               DISCHARGE SUMMARY   PRIMARY MD:  Dr. Einar Crow, [facility], [place], [place].   DISCHARGE DIAGNOSES:  1. Parenchymal nodular amyloidosis.  2. Rheumatoid arthritis.  3. Sjogren's syndrome.  4. Obstructive airways disease, new diagnosis.  5. Mild-to-moderate aortic stenosis.  6. Dyslipidemia.  7. Hypothyroidism.  8. Postnasal drip.  9. Noncardiac chest pain.   DISCHARGE MEDICATIONS:  1. Aspirin (enteric-coated) 81 mg p.o. daily.  2. Zetia 10 mg p.o. daily.  3. Hydrochlorothiazide 25 mg p.o. daily.  4. Synthroid 50 mcg p.o. daily.  5. Ibuprofen 400 mg p.o. p.r.n. t.i.d. with food.  6. Flonase nasal spray two sprays each nostril daily.  7. Symbicort (160/4.5) two puffs b.i.d.  8. Spiriva HandiHaler one capsule daily.  9. Omega 3 fatty acid (fish oil) 1000 mg p.o. b.i.d.  10.Ventolin MDI two puffs p.r.n. q.4-6 hourly.  11.Prednisone 60 mg daily on Jun 06, 2008 and then 40 mg daily for 3      days and then 30 mg daily for 3 days and then 20 mg daily for 3      days and then 10 mg daily for 3 days and then stop.   PROCEDURES:  1. Chest x-ray dated Jun 01, 2008.  This showed numerous calcified      nodules throughout both lungs with conglomerate calcified pleural      parenchymal opacity in the left lower lobe.  These findings may      well be due to amyloidosis.  No acute cardiopulmonary disease is      suspected.  2. Chest CT angiogram dated Jun 01, 2008.  This showed no acute      findings.  There were multiple bilateral calcified pulmonary      nodules with associated cystic cavities.  These findings are  consistent with parenchymal nodular amyloidosis.  3. A 2-D echocardiogram done Jun 02, 2008.  This showed normal left      ventricular cavity size, wall thickness was normal, systolic      function was normal, EF 55-65%, normal wall motion, no regional      wall motion abnormalities, increased relative contribution of      atrial contraction to ventricular filling.  Aortic valve showed      severe stenosis, moderate regurgitation.  Valve area was 0.83 cm/2      (CTI) valve area 0.77 cm/2 (V max).  Mitral valve had a severely      calcified annulus, mild focal thickening and calcification of the      anterior leaflet and posterior leaflet with mild involvement of the      cords.  Mild-to-moderate regurgitation.  There was increased      thickness of the atrial septum consistent with lipomatous  hypertrophy.  4. Repeat limited transthoracic echocardiogram done Jun 04, 2008.      This showed normal LV function.  EF 60%.  Mild LVH.  Mitral annular      calcification.  Calcified AV with restricted motion.  Mild-to-      moderate aortic stenosis.  Normal RV.  Aortic root was not well      visualized.   CONSULTATIONS:  1. Dr. Marca Ancona, cardiologist.  2. Dr. Kalman Shan, pulmonologist.   ADMISSION HISTORY:  As in H and P notes of Jun 01, 2008, dictated by Dr.  Paulette Blanch Dam.  However in brief, this is an 77 year old female,  with known history of biopsy confirmed amyloidosis, diagnosed in 1989 at  [facility], rheumatoid arthritis, Sjogren's  syndrome, hypertension, dyslipidemia, presenting with chest tightness  and progressive shortness of breath on exertion of two weeks duration.  Reportedly,  she had developed signs of congestion, postnasal drip as  well as pleuritic type chest pain and tightness, just prior.  She was at  that time treated by her primary M.D., Dr. Einar Crow, [facility] [place], with antibiotics and  further  medications with apparent resolution of symptoms.  She now presents  because of above-mentioned symptoms over the last two weeks and was  admitted for further management and investigation and management.   HOSPITAL COURSE:  1. Obstructive airway disease.  For details of presentation, refer to      admission history above.  Cardiac enzymes were cycled and remained      unelevated.  A 12-lead EKG showed no acute ischemic changes.  Chest      x-ray showed bilateral calcified pulmonary nodules.  Chest CT      angiogram showed no evidence of pulmonary embolism;  however,      confirmed chest x-ray findings which were characterized as      parenchymal nodular amyloidosis.  Pulmonary consultation was      called, which was kindly provided by Dr. Kalman Shan.  For      details of consultation, refer to consultation notes of Jun 03, 2008.  He recommended bronchodilators, tapering course of steroids      and did formal pulmonary function testing, i.e., on Jun 03, 2008.      This was reported as showing moderate obstructive airway disease,      possible restriction and severe diffusion defect.  Be that as it      may, the patient responded to tratment with steroids,      bronchodilators, oxygen supplementation.  By Jun 05, 2008, symptoms      were considerably ameliorated with only minimal shortness of breath      promptly relieved by bronchodilators, and resolution of chest      tightness.   1. Aortic stenosis.  As reported above, the patient did have chest      tightness/discomfort, raising the specter of coronary artery      disease.  Cardiology consultation was called.  This was kindly      provided by Dr. Marca Ancona.  For details of that consultation,      refer to consultation notes of Jun 03, 2008.  He entertained the      consideration of cardiac catheterization following completion of      pulmonary workup; however, arranged a 2-D echocardiogram, which       confirmed moderate aortic stenosis.  Although this was  initially      described as severe aortic stenosis, cardiologist has opined that      on the basis of physical examination as well as the patient's      symptomatology and other data, the stenosis was mild-to-moderate at      best, and was not likely responsible for her symptomatology.   1. Parenchymal nodular amyloidosis.  This was confirmed by imaging      studies carried out during the course of hospitalization, and      appears to have been stable for at least two decades.   1. Hypertension.  This was controlled with pre-admission dose of      Hydrochlorothiazide, and the patient was normotensive throughout      the course of hospitalization.   1. Dyslipidemia.  Although cardiologist recommended starting      treatment, review of the patient's medical history revealed that      she has in the past, had myositis from statins.  This will be      listed as well one of her medication intolerances.  She was      therefore commenced on Zetia accordingly.   1. Postnasal drip.  This was felt contributing to the patient's airway      disease, and was managed with Flonase nasal spray during the course      of this hospitalization, with amelioration.   1. Hypothyroidism.  The patient was continued on pre-admission dose of      Synthroid.  TSH was euthyroid at 0.678.   1. Noncardiac chest pain.  As pointed out above, the patient presented      with chest pressure/tightness associated with shortness of breath      on exertion.  This is felt to be noncardiac in origin.  She had no      evidence of acute coronary syndrome and chest pain readily      ameliorated with resolution of pulmonary symptoms, as well as      utilization of NSAID therapy.   DISPOSITION:  The patient was on Jun 05, 2008 practically asymptomatic  and considered clinically stable to be discharged.  She was seen by Dr.  Marca Ancona and Dr. Coralyn Helling in the a.m. of  Jun 05, 2008 and has  been cleared from cardiologic and pulmonary viewpoints, for discharge.  She was therefore discharged accordingly.   DIET:  Heart healthy.   ACTIVITY:  As tolerated.  Recommended to increase activity slowly.   FOLLOW UP:  The patient is to follow up with Dr. Kalman Shan,  pulmonologist, telephone number [phone] on Jun 13, 2008, at 11:40  a.m.  The patient has been supplied the appropriate details.  She is to  follow up with Dr. Marca Ancona, cardiologist, [place] at their  [facility] office on June 25, 2008 at 3:30 p.m.  Again, details have been  supplied the patient.  In addition, she is to follow up with her primary  M.D., Dr. Einar Crow, [facility], per prior scheduled  appointment.  All of this has been communicated to the patient and her  family.  They verbalized understanding.      Isidor Holts, M.D.  Electronically Signed     CO/MEDQ  D:  06/05/2008  T:  06/05/2008  Job:  [id number]   cc:   Einar Crow, M.D.  Marca Ancona, MD  Kalman Shan, MD

## 2010-06-01 NOTE — Consult Note (Signed)
NAMEPHILIS, DOKE NO.:  [id number]   MEDICAL RECORD NO.:  [id number]          PATIENT TYPE:  INP   LOCATION:  3705                         FACILITY:  [facility]   PHYSICIAN:  Marca Ancona, MD      DATE OF BIRTH:  December 01, 1919   DATE OF CONSULTATION:  06/03/2008  DATE OF DISCHARGE:                                 CONSULTATION   PRIMARY CARE PHYSICIAN:  Dr. Einar Crow at the [facility] in  [place].   PRIMARY CARDIOLOGIST:  New and will be Dr. Marca Ancona in [place].   CHIEF COMPLAINT:  Chest pain.   HISTORY OF PRESENT ILLNESS:  Ms. Janco is an 77 year old female with no  history of coronary artery disease.  She developed chest pain that was  described as pleuritic and possibly secondary to an upper respiratory  infection in January and February.  She was treated with pulmonary  medications and antibiotics and her symptoms resolved.  Two weeks ago,  she noticed increased dyspnea on exertion.  In association with this,  there is an upper chest pain that she describes as a tightness.  It  reaches an 8/10 at its worst.  It does not change with deep inspiration,  and her shortness of breath gets worse when she lies flat, but she does  not feel that this changes her chest pain at all.  She denies PND.  Her  symptoms worsened until she felt short of breath at rest, so she came to  the emergency room.  Nebulizers have helped her symptoms.  Approximately  2 weeks ago when she first developed these symptoms, she saw her primary  MD.  She received treatment that may have included antibiotics, but this  did not resolve her symptoms.  In the hospital, her symptoms have  improved; however, her activity level has also greatly decreased and  this may be contributing to the improvement.  Prior to her symptoms in  January 2010, she had never had this type of symptom before.   PAST MEDICAL HISTORY:  1. Hypertension.  2. Hyperlipidemia.  3. Family history of  coronary artery disease.  4. Rheumatoid arthritis, not on steroids or other medical therapy.  5. History of pulmonary parenchymal nodular amyloidosis, diagnosed at      [facility] in 1989, and asymptomatic since then.  6. History of Sjogren syndrome.  7. Hypothyroidism.   SURGICAL HISTORY:  She is status post biopsy which finalized a diagnosis  of amyloidosis and bilateral tubal ligation.   ALLERGIES:  She is allergic or intolerant to PENICILLIN, STATINS, and  ALEVE.   CURRENT MEDICATIONS:  1. Ventolin nebulizers q.6 hours.  2. Flonase nasal spray  3. Heparin 5000 units q.8 hours pain.  4. Hydrochlorothiazide 25 mg daily.  5. Advil 400 mg t.i.d.  6. Synthroid 50 mcg daily.  7. Protonix 40 mg a day.  8. Potassium 20 mEq b.i.d.  9. Ambien 5 mg at bedtime.   SOCIAL HISTORY:  She lives in [place] alone.  She is retired and has no  history of alcohol, tobacco, or drug abuse.  She feels that  she eats a  healthy diet and is very active around the house, doing housework, yard  work, and other activities.   FAMILY HISTORY:  Her mother at died at age 53 and her father died at age  46, both with a history of heart disease and she had coronary artery  disease in one of her siblings and 2 sons.   REVIEW OF SYSTEMS:  She has not had fevers, chills, or sweats.  She  describes chest pain as well as shortness of breath, dyspnea on  exertion, and orthopnea, but not PND or edema.  She has not had  palpitations in many years.  She has been coughing a little and wheezing  a lot.  She describes herself as having a weak bladder, and by  description this is a stress incontinence and urge incontinence.  She  has occasional arthralgias.  She has had some lower GI symptoms that she  feels are nausea but no vomiting or diarrhea.  She has not had abdominal  pain.  Full 14-point review of systems is otherwise negative.   PHYSICAL EXAMINATION:  VITAL SIGNS:  Temperature is 98.1, blood pressure  106/55, pulse  79, respiratory rate 16, O2 saturation 95% on room air.  GENERAL:  She is a well-developed elderly white female in no acute  distress.  HEENT:  Normal.  NECK:  There is no lymphadenopathy, thyromegaly, or JVD noted, but she  has a murmur that radiates to her carotids.  CV:  Her heart is regular in rate and rhythm with an S1 and S2, and a  2/6 murmur is heard but the S2 is heard clearly.  There are murmurs at  the right upper sternal border.  Carotid upstrokes appear strong.  LUNGS:  Decreased breath sounds at the bases.  SKIN:  No rashes or lesions are noted.  ABDOMEN:  Soft and nontender with active bowel sounds.  EXTREMITIES:  There is no cyanosis, clubbing, or edema noted.  MUSCULOSKELETAL:  There is no joint deformity or effusions and no spine  or CVA tenderness.  NEUROLOGIC:  She is alert and oriented.  Cranial nerves II through XII  grossly intact.   CT angiogram of the chest, no acute findings.  Multiple bilateral  calcified pulmonary nodules with associated cystic cavity, consistent  with parenchymal nodular amyloidosis.  There is no PE.   LABORATORY VALUES:  BNP 155.  CK-MB and troponin I negative x3.  CRP  0.1.  TSH 0.678.  Urinalysis and fecal occult blood both negative.  Hemoglobin 12.0, hematocrit 35.1, WBC 6.6, platelets 262.  Sodium 133,  potassium 3.8, chloride 98, CO2 30, BUN 9, creatinine 0.64, glucose 97.   Echocardiogram performed on Jun 02, 2008, shows an EF of 55-65% with no  wall motion abnormalities.  Severe AS with an aortic valve area of 0.77  sq. cm by V-max and a severely calcified mitral valve with mild-to-  moderate regurgitation.  She has a lymphomatous hypertrophy of the  septum.  Mean aortic valve gradient is 16 mmHg, and she has mild LVH as  well.   IMPRESSION:  Ms. Hagg was seen today by Dr. Shirlee Latch.  She has a history  of parenchymal nodular amyloidosis and presented with a 2-week history  of chest tightness and dyspnea with minimal exertion  progressing to  dyspnea at rest.  She is very functional at her baseline.  Dyspnea/chest  pain:  It occurs both with exertion and also at rest but only for the  last  2 weeks.  The symptoms that she had during an upper respiratory  infection in January and February were not as severe.  She has a history  of parenchymal nodular amyloidosis since the 1980s, but per Pulmonary  this diagnosis tends to be benign and the patient has done well with it  for years.  Cardiac enzymes are negative x3, and BNP is only mildly  elevated.  She lives alone and was very functional prior to the last few  weeks.  The echocardiogram shows a severely restricted aortic valve, but  the mean gradient is only 16 mmHg.  Her exam is consistent with moderate  aortic stenosis.  She is to get PFTs today and these results will be  discussed with Dr. Marchelle Gearing.  If her PFTs are not severely abnormal,  then a right and left heart catheterization is indicated to assess her  aortic stenosis and the accuracy of the gradient by echo.  Her  coronaries should also be assessed because of the strong family history  of coronary artery disease among other risk factors.  Thank you for this  very interesting consult.      Theodore Demark, PA-C      Marca Ancona, MD  Electronically Signed    RB/MEDQ  D:  06/03/2008  T:  06/04/2008  Job:  [id number]

## 2010-06-21 ENCOUNTER — Other Ambulatory Visit: Payer: Self-pay | Admitting: Internal Medicine

## 2010-06-25 ENCOUNTER — Encounter: Payer: Self-pay | Admitting: Cardiovascular Disease

## 2010-08-28 IMAGING — CT CT ANGIO CHEST
2 of 6 series · 19 of 36 positions shown · IV contrast (APPLIED)
Comparison: Chest x-ray on same date

CLINICAL DATA: Chest pain and shortness of breath.  History of
lung amyloidosis and prior lung surgery.  Chest x-ray demonstrates
bilateral calcified pulmonary nodules.

CT ANGIOGRAPHY CHEST WITH CONTRAST
TECHNIQUE: Multidetector CT imaging of the chest was performed
using the standard protocol during bolus administration of
intravenous contrast. Multiplanar CT image reconstructions
including MIPs were obtained to evaluate the vascular anatomy.
Contrast: 60 ml Vmnipaque-SQQ IV

[Series 8: pulm embolism 3.0 b25f thins · axial · 0.58mm/px · z∈[-314,-32]mm · 18 of 104 slices shown]
[im 5/104  lung]
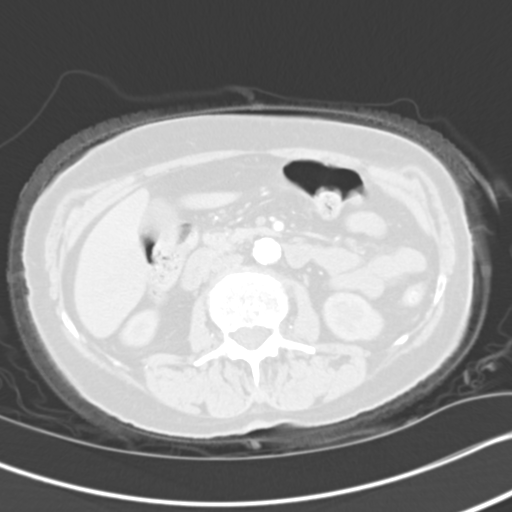
[im 13/104  mediastinal]
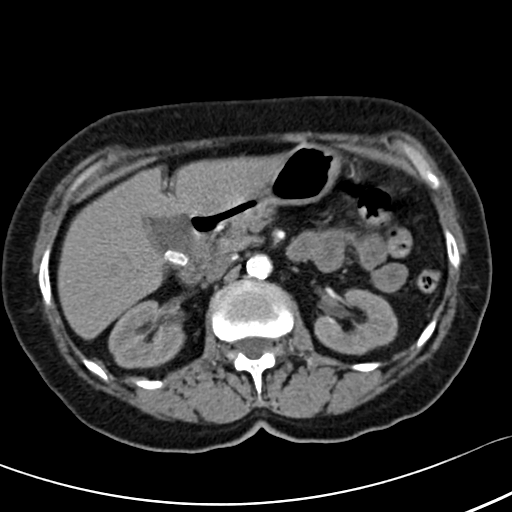
[im 17/104  lung]
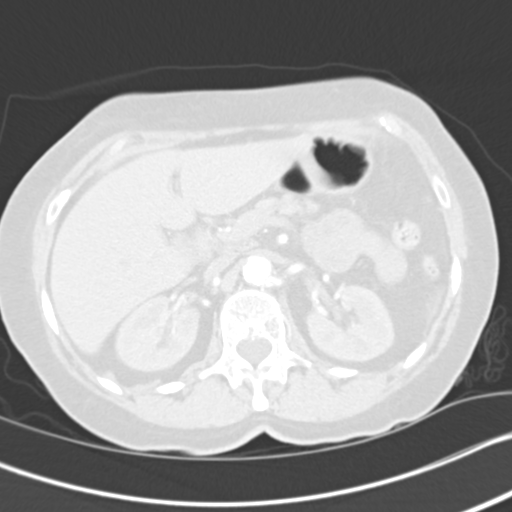
[im 21/104  mediastinal]
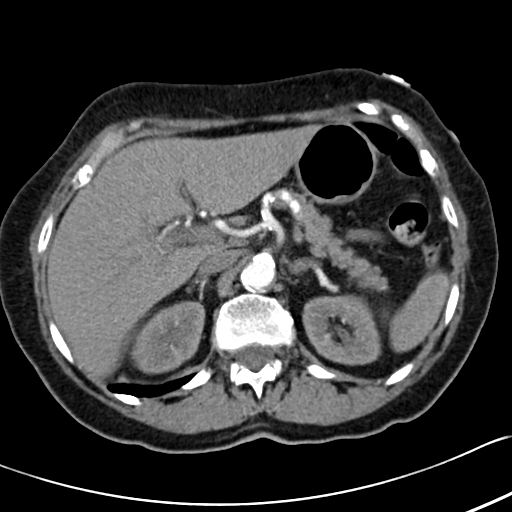
[im 29/104  lung]
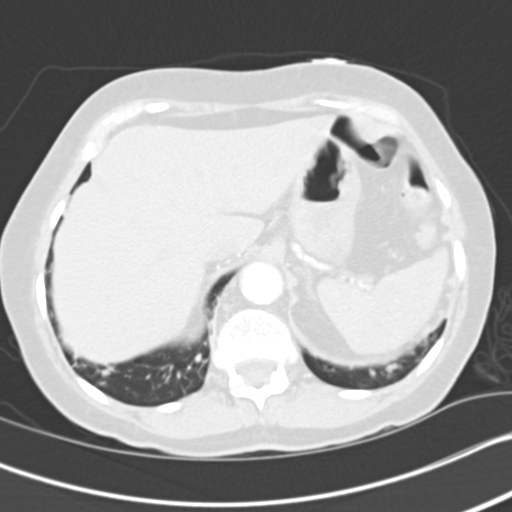
[im 33/104  mediastinal]
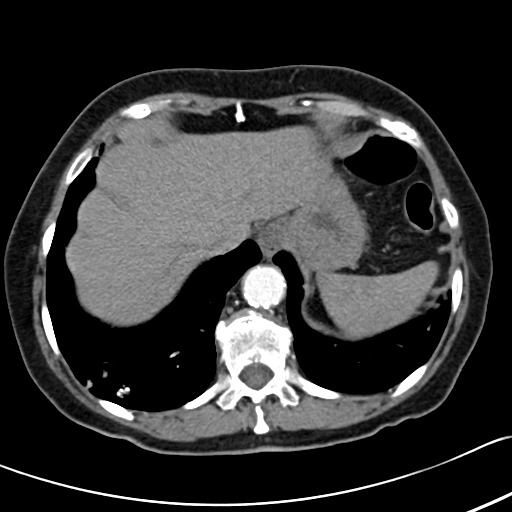
[im 38/104  lung]
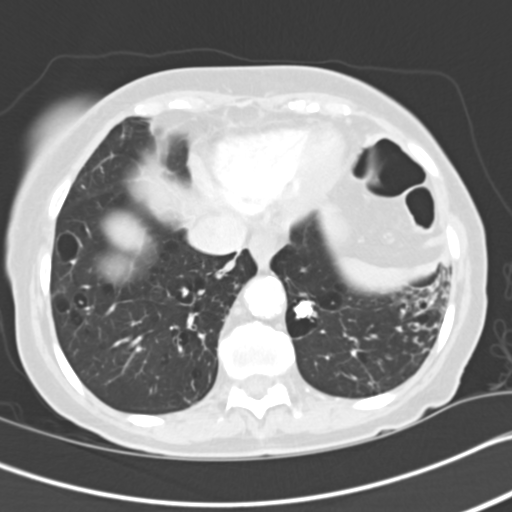
[im 46/104  mediastinal]
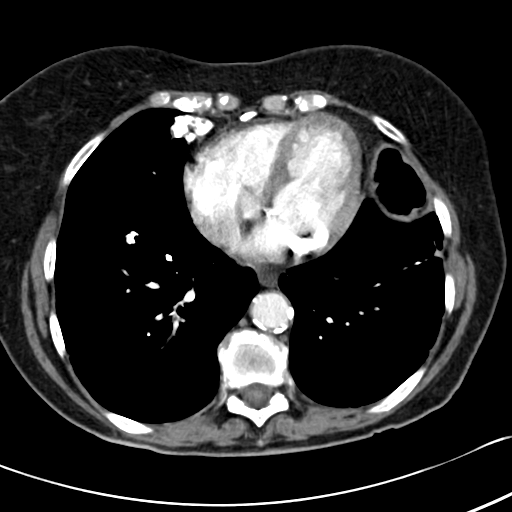
[im 50/104  lung]
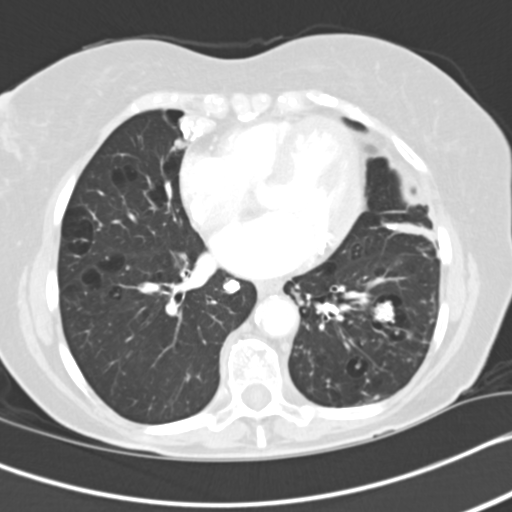
[im 54/104  mediastinal]
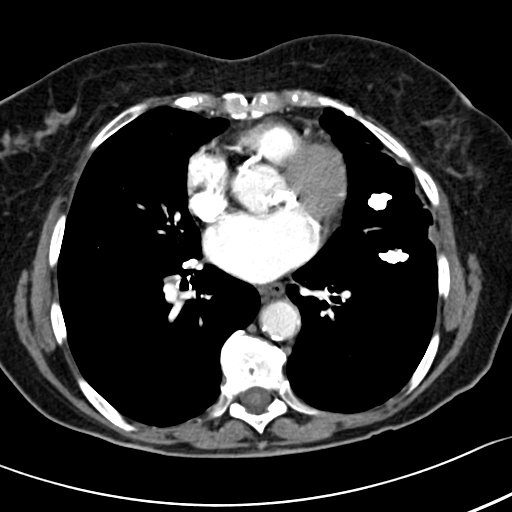
[im 62/104  lung]
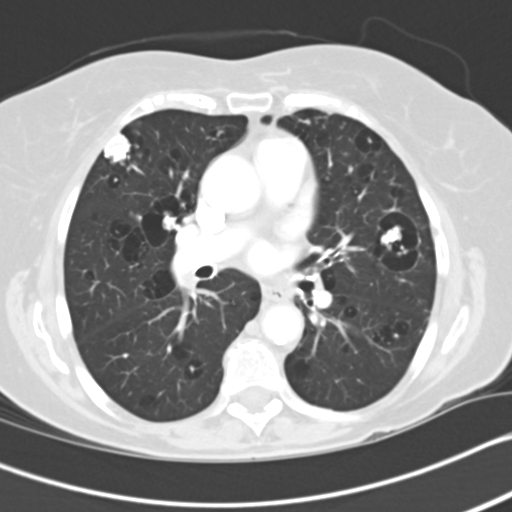
[im 66/104  mediastinal]
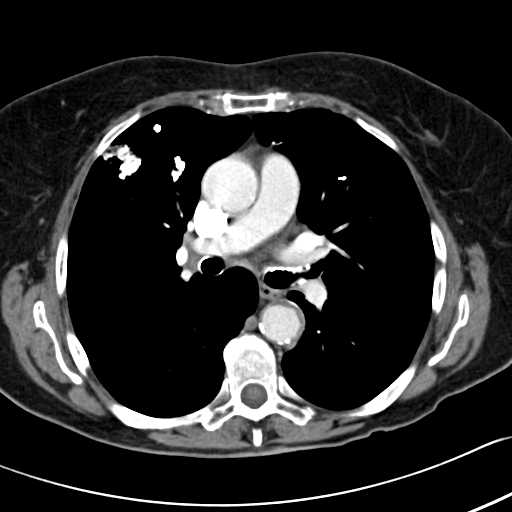
[im 71/104  lung]
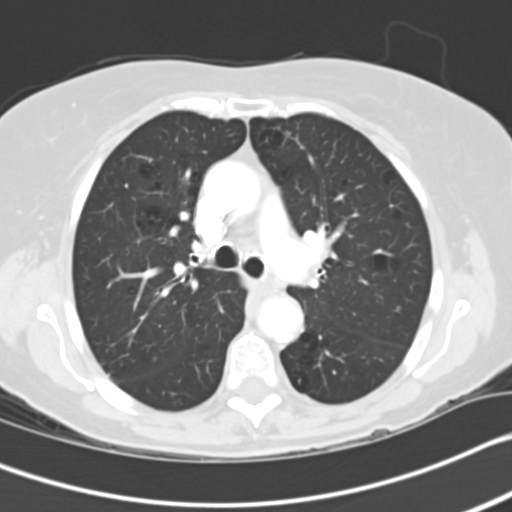
[im 79/104  mediastinal]
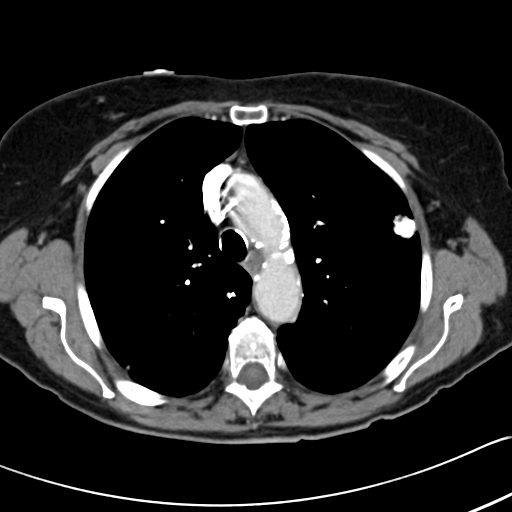
[im 83/104  lung]
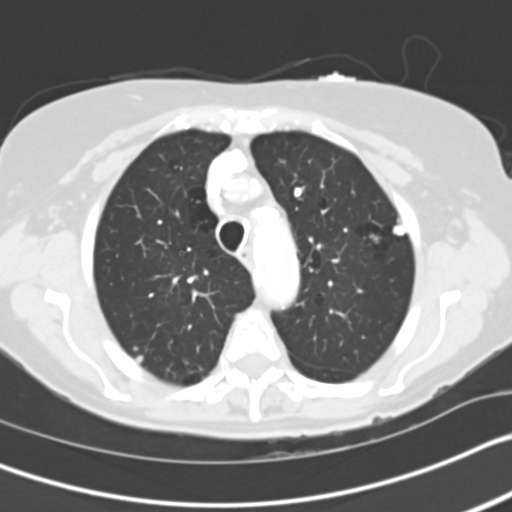
[im 87/104  mediastinal]
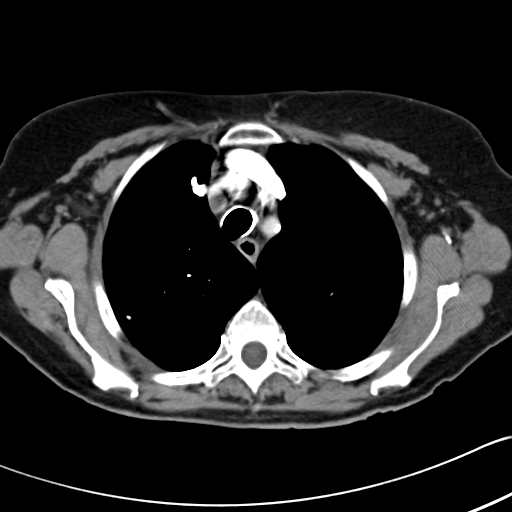
[im 95/104  lung]
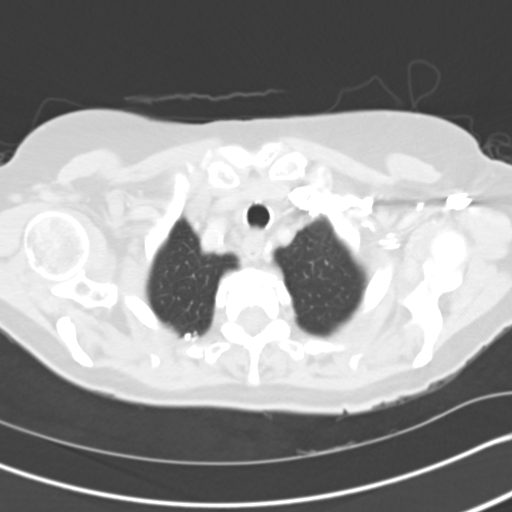
[im 99/104  mediastinal]
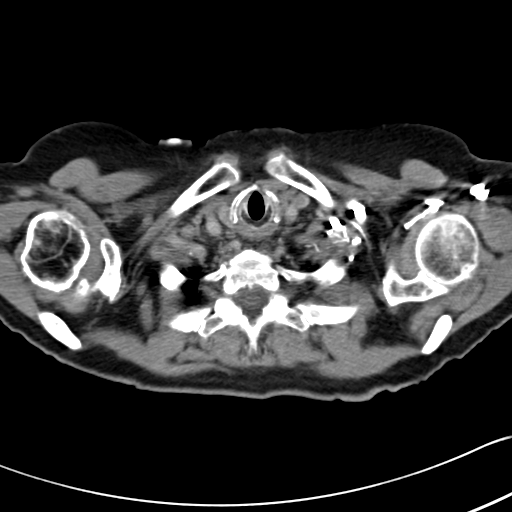

[Series 602: cor chest · coronal · 0.60mm/px · 1 of 105 slices shown]
[im 53/105  mediastinal]
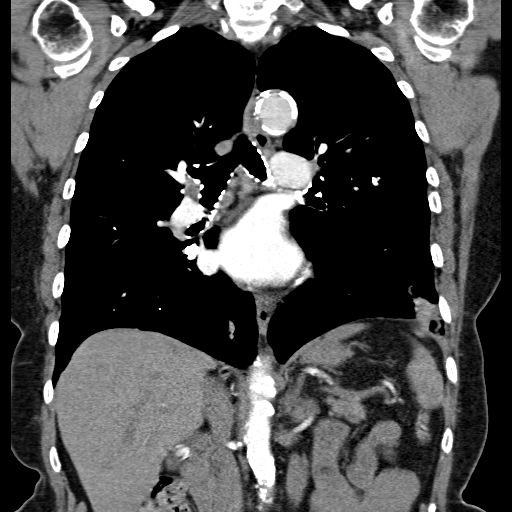

[19 of 36 positions shown; findings below may reference images not displayed]

FINDINGS: Multiple bilateral calcified pulmonary nodules are
identified.  Many of these are associated with cystic cavities in
the lungs.  The largest lies in the anterolateral right upper lobe
measuring approximately 2.5 cm in greatest diameter.  Given the
stated history of amyloidosis, findings are consistent with
parenchymal nodular amyloidosis.

The pulmonary arteries are adequately opacified and demonstrate no
evidence of pulmonary embolism.  No acute infiltrate, edema or
effusion is seen.  There are no enlarged lymph nodes.

Review of the MIP images confirms the above findings.
IMPRESSION: No acute findings.  Multiple bilateral calcified pulmonary nodules
with associated cystic cavities.  Findings are consistent with
parenchymal nodular amyloidosis.

## 2010-09-22 ENCOUNTER — Ambulatory Visit: Payer: Self-pay | Admitting: Rheumatology

## 2011-02-15 ENCOUNTER — Ambulatory Visit: Payer: Self-pay | Admitting: Internal Medicine

## 2011-04-04 ENCOUNTER — Encounter: Payer: Self-pay | Admitting: Internal Medicine

## 2011-04-05 ENCOUNTER — Encounter: Payer: Self-pay | Admitting: Internal Medicine

## 2011-04-05 ENCOUNTER — Ambulatory Visit (INDEPENDENT_AMBULATORY_CARE_PROVIDER_SITE_OTHER): Payer: Self-pay | Admitting: Internal Medicine

## 2011-04-05 VITALS — BP 122/78 | HR 82 | Temp 98.1°F | Ht <= 58 in | Wt 108.8 lb

## 2011-04-05 DIAGNOSIS — E859 Amyloidosis, unspecified: Secondary | ICD-10-CM

## 2011-04-05 DIAGNOSIS — J309 Allergic rhinitis, unspecified: Secondary | ICD-10-CM

## 2011-04-05 MED ORDER — FLUTICASONE PROPIONATE 50 MCG/ACT NA SUSP: 2.0000 | Freq: Every day | NASAL | Status: AC

## 2011-04-05 NOTE — Assessment & Plan Note (Signed)
#  Amyloidosis/Asthma  - clinical monitoring #Followup  9 -1 2 months or sooner if needed

## 2011-04-05 NOTE — Progress Notes (Signed)
  Subjective:    Patient ID: Denise Bell, female    DOB: March 08, 1919, 78 y.o.   MRN: [id number]  HPI  78 yo with history of rheumatoid arthritis, localized pulmonary amyloidosis, Sjogren's syndrome, obstructive lung disease, and aortic stenosis presents in followup. Patient was admitted to [facility] in 5/10 with pleuritic chest pain and shortness of breath likely due to bronchospasm. . She then had PFTs done, showing a severe obstructive defect responsive to bronchodilators. The patient's presentation was thought to be due to bronchiolitis obliterans of RA versus asthma.   05/01/2009: Followup leg crampos, dyspnea, gold stage 2 copd. Dyspnea is nearly resolved/stable. WEll controlled. Takes symbicort 1 puff bid. No new symptoms. No wheeze. Feels good from lung health perspective. Wants to cut down symbicort to 1 puff daily (trial of this in July 2010 casues recurrent symptoms). Of note, she has had intolerance to spiriva - chest tightnes. Leg cramps continue. Being taken care of by PMd. No new symptoms. REC: CONTNUE SYMBICORT   January 25, 2010: Followup localized pulm amyloid/asthma - copd. Doing well. Several months ago cut down symbicort to 1 puff dialy. No deterioration. She wants to eliminate drug completely now.  No wheeze, dyspnea, chest pain, edema, cough, fever, chills, weight loss.   Patient Instructions:  1) glad you are doing better  2) please continue symbicort 1 puff daily atleast through end may  3) return in 1 year  4) please take sample of symbicort  5) come sooner if there are problems            OV 04/05/2011 Post nasal drip since Jan 2013. ENT Rx with abx and saline irrigation helped. After that 2 more abx course but still sinus drainage. Constant. Moderate, Clear. No aggravating or relieving factors. Thinks she might be allergic to pollen. Reactive mild cough due to sinus drainage and wheeze present. Not using symbicort but otherwise asymptomatic. Says outside cxr per report  in [place] has been stable. No other problems. Good appetitei. Maintaing wegith. Very functional.  Review of Systems  Constitutional: Negative for fever and unexpected weight change.  HENT: Positive for congestion and postnasal drip. Negative for ear pain, nosebleeds, sore throat, rhinorrhea, sneezing, trouble swallowing, dental problem and sinus pressure.   Eyes: Negative for redness and itching.  Respiratory: Positive for cough. Negative for chest tightness, shortness of breath and wheezing.   Cardiovascular: Negative for palpitations and leg swelling.  Gastrointestinal: Negative for nausea and vomiting.  Genitourinary: Negative for dysuria.  Musculoskeletal: Negative for joint swelling.  Skin: Negative for rash.  Neurological: Negative for headaches.  Hematological: Does not bruise/bleed easily.  Psychiatric/Behavioral: Negative for dysphoric mood. The patient is not nervous/anxious.        Objective:   Physical Exam Physical Exam  General: normal appearance.  Head: normocephalic and atraumatic  Eyes: PERRLA/EOM intact; conjunctiva and sclera clear  Nose: clear nasal discharge, no sinus tenderness  Mouth: SIGNIFICANT POST NASAL DRAIANGE ON UVuLA + Neck: no JVP  Lungs: decreased breath sounds, no wheezing or rales  Heart: regular rhythm and normal rate with 2/6 SM  Abdomen: soft, nontender, no masses, normal bowel sounds  Pulses: pulses normal  Extremities: no edema  Skin: left arm bruised from IV during recetn hospitalization  Cervical Nodes: no significant adenopathy  Psych: alert and cooperative; normal mood and affect; normal attention span and concentration        Assessment & Plan:

## 2011-04-05 NOTE — Patient Instructions (Signed)
#  post nasal drip  - do netti pot saline wash with boiled or distilled/bottled warm water with salt packets atleast 3 times per week  -  take generic fluticasone inhaler 2 squirts each nostril daily - continue this up until summer  - call back or come sooner if not getting better #Amyloidosis/Asthma  - clinical monitoring #Followup  9 -1 2 months or sooner if needed

## 2011-04-05 NOTE — Assessment & Plan Note (Signed)
#  post nasal drip  - do netti pot saline wash with boiled or distilled/bottled warm water with salt packets atleast 3 times per week  -  take generic fluticasone inhaler 2 squirts each nostril daily - continue this up until summer  - call back or come sooner if not getting better

## 2011-04-14 ENCOUNTER — Telehealth: Payer: Self-pay | Admitting: Internal Medicine

## 2011-04-14 MED ORDER — ALBUTEROL SULFATE HFA 108 (90 BASE) MCG/ACT IN AERS: 2.0000 | INHALATION_SPRAY | Freq: Four times a day (QID) | RESPIRATORY_TRACT | Status: DC | PRN

## 2011-04-14 MED ORDER — BUDESONIDE-FORMOTEROL FUMARATE 80-4.5 MCG/ACT IN AERO: 2.0000 | INHALATION_SPRAY | Freq: Two times a day (BID) | RESPIRATORY_TRACT | Status: DC

## 2011-04-14 NOTE — Telephone Encounter (Signed)
[id number]. The pt had OV 04-05-11 and was doing well and off of symbicort at that time, what has changed? Carron Curie, CMA

## 2011-04-14 NOTE — Telephone Encounter (Signed)
Pt states that she had been doing fine off of Symbicort until last week. She believes that there is more pollen in the air and this has affected her breathing. She also has no rescue inhaler. Pls advise. Allergies  Allergen Reactions  . Epinephrine   . Moxifloxacin     REACTION: hallucinations, confusions and 'seeing things'  . Naproxen Sodium   . Penicillins     REACTION: hives  . Sulfonamide Derivatives   . Thimerosal

## 2011-04-14 NOTE — Telephone Encounter (Signed)
Pt is calling back needs to know soon as she can so she can pick it up due to pollen she thinks she needs one

## 2011-04-14 NOTE — Telephone Encounter (Signed)
Rxs sent to [facility] with pt and notified of this and she verbalized understanding and states nothing further needed.

## 2011-04-14 NOTE — Telephone Encounter (Signed)
Go back on symbicort 80/4.5 2 puff bid but atleast 1 puff bid  Give albuterol prn

## 2011-04-14 NOTE — Telephone Encounter (Signed)
Patient returning call.

## 2011-05-03 ENCOUNTER — Ambulatory Visit: Payer: Self-pay

## 2012-01-06 ENCOUNTER — Ambulatory Visit: Payer: Medicare Other | Admitting: Internal Medicine

## 2012-01-30 ENCOUNTER — Ambulatory Visit: Payer: Medicare Other | Admitting: Internal Medicine

## 2012-03-26 ENCOUNTER — Ambulatory Visit: Payer: Medicare Other | Admitting: Internal Medicine

## 2013-05-19 ENCOUNTER — Emergency Department: Payer: Self-pay | Admitting: Internal Medicine

## 2013-05-19 LAB — BASIC METABOLIC PANEL
ANION GAP: 6 — AB (ref 7–16)
BUN: 9 mg/dL (ref 7–18)
CO2: 30 mmol/L (ref 21–32)
CREATININE: 0.45 mg/dL — AB (ref 0.60–1.30)
Calcium, Total: 9.4 mg/dL (ref 8.5–10.1)
Chloride: 96 mmol/L — ABNORMAL LOW (ref 98–107)
EGFR (African American): >60
EGFR (Non-African Amer.): >60
Glucose: 90 mg/dL (ref 65–99)
Osmolality: 263 (ref 275–301)
POTASSIUM: 4.1 mmol/L (ref 3.5–5.1)
SODIUM: 132 mmol/L — AB (ref 136–145)

## 2013-05-19 LAB — CBC
HCT: 40.2 % (ref 35.0–47.0)
HGB: 13.4 g/dL (ref 12.0–16.0)
MCH: 27.2 pg (ref 26.0–34.0)
MCHC: 33.2 g/dL (ref 32.0–36.0)
MCV: 82 fL (ref 80–100)
PLATELETS: 259 10*3/uL (ref 150–440)
RBC: 4.91 10*6/uL (ref 3.80–5.20)
RDW: 13.8 % (ref 11.5–14.5)
WBC: 9.8 10*3/uL (ref 3.6–11.0)

## 2013-05-19 LAB — HEPATIC FUNCTION PANEL A (ARMC)
ALBUMIN: 3.2 g/dL — AB (ref 3.4–5.0)
ALK PHOS: 73 U/L
ALT: 23 U/L (ref 12–78)
Bilirubin, Direct: 0.1 mg/dL (ref 0.00–0.20)
Bilirubin,Total: 0.6 mg/dL (ref 0.2–1.0)
SGOT(AST): 25 U/L (ref 15–37)
TOTAL PROTEIN: 8.1 g/dL (ref 6.4–8.2)

## 2013-05-19 LAB — PRO B NATRIURETIC PEPTIDE: B-Type Natriuretic Peptide: 4366 pg/mL — ABNORMAL HIGH (ref 0–450)

## 2013-05-19 LAB — TROPONIN I

## 2013-05-22 ENCOUNTER — Ambulatory Visit (INDEPENDENT_AMBULATORY_CARE_PROVIDER_SITE_OTHER): Payer: Medicare Other | Admitting: Pulmonary Disease

## 2013-05-22 ENCOUNTER — Encounter: Payer: Self-pay | Admitting: Pulmonary Disease

## 2013-05-22 VITALS — BP 132/66 | HR 100 | Ht <= 58 in | Wt 99.0 lb

## 2013-05-22 DIAGNOSIS — R0602 Shortness of breath: Secondary | ICD-10-CM

## 2013-05-22 DIAGNOSIS — J449 Chronic obstructive pulmonary disease, unspecified: Secondary | ICD-10-CM

## 2013-05-22 DIAGNOSIS — R011 Cardiac murmur, unspecified: Secondary | ICD-10-CM

## 2013-05-22 MED ORDER — PREDNISONE 20 MG PO TABS: 10.0000 mg | ORAL_TABLET | Freq: Every day | ORAL | Status: DC

## 2013-05-22 NOTE — Progress Notes (Signed)
Subjective:    Patient ID: Denise Bell, female    DOB: March 11, 1919, 80 y.o.   MRN: [id number]   Synopsis: Denise Bell was first evaluated by the [facility] pulmonary clinic in may of 2015 after she been followed for several years in [place] at the [facility] pulmonary office. She is a very interesting pulmonary history in that she was diagnosed with localized pulmonary amyloidosis by open lung biopsy at [facility] in 1992. Starting around 2000 and then she developed increasing shortness of breath with airflow obstruction. She responded to treatment as if she had asthma. The thought was that she possibly had bronchiolitis obliterans versus new asthma. She also has mild aortic stenosis which been followed by Dr. Marca Ancona in [place].   HPI  This is a very pleasant 80 year old female who has previously been followed by my partner in [place] several years ago who returns to pulmonary care today for the first time in 2 years. Her past medical history in pulmonary history is outlined as above.  She recently went to the emergency department because of trouble breathing. She says that she had noticed this developing over the course of several weeks. Typically she has problems with her shortness of breath and wheezing during the summer months. She started taking Symbicort again about 2 weeks ago after a hiatus in the wintertime. She said that when she noticed the pollen coming out about 2 weeks ago she did feel increasing chest tightness and shortness of breath. She had been using the Symbicort 2 puffs twice a day but despite this she started to have increasing shortness of breath. She did not have a significant amount of mucus production but she did have a dry cough associated with sinus congestion. She did note some mild wheezing. Albuterol did not help significantly. She doesn't like taking medications of any kind she can help it.  When she was in the  emergency department she was given breathing treatments and a prescription for prednisone to last only 2 days. She is also given some Lasix. She did not fill the prescription for the Lasix at home. She has not noticed increasing ankle swelling. She has not had chest pain. She's not had fevers chills or hemoptysis.   Past Medical History  Diagnosis Date  . Rheumatoid arthritis(714.0)   . Sjogren's syndrome   . Hypertension   . Dyslipidemia     Very severe muscle soreness and wekness with use of Lipitor and will not take statin again  . Aortic stenosis     Mild to mod. Echo 5/10 with EF 55-65%, mild to mod AS with mean gradient 19 mmHg, mild LVH. Phsycial exam consistent with mild to mod AS. Initial echo 5/10 was read as "severe aortic stenosis" so cardio became involved. Echo 6/11 mild to mod AS and normal systolic func (no change). Reinterpretation of echo shows AS still mild to mod  . Pulmonary amyloidosis 1992    Localized, dx by lung bx at [facility]; This type of amyloidosis is isolated and should not affect the heart  . Obstructive lung disease     Non smoker, 5/10 due to LPA; PFTs 06/03/08 reported mod obstruction w/ severe diffusion defect due to systs/cavitis of amyloidosis vs new asthma vs bronchiolitis obliterans of ra; 2 week pred trial; spirometry  . Chronic rhinitis   . Leg cramps 2010    Unrelated to pulmonary inhalers, tested and proven using Koch's postulate     Family History  Problem  Relation Age of Onset  . Heart attack Mother 51  . Heart attack Father 71  . Heart attack Sister   . Heart attack Brother 42  . Coronary artery disease Other   . Heart attack Brother 81  . Heart attack Sister      History   Social History  . Marital Status: Widowed    Spouse Name: N/A    Number of Children: N/A  . Years of Education: N/A   Occupational History  . Not on file.   Social History Main Topics  . Smoking status: Never Smoker   . Smokeless tobacco: Never Used  . Alcohol  Use: No  . Drug Use: No  . Sexual Activity: Not on file   Other Topics Concern  . Not on file   Social History Narrative   Widowed   Lives alone but several children live near   Known as [employer] regular exercise     Allergies  Allergen Reactions  . Epinephrine   . Moxifloxacin     REACTION: hallucinations, confusions and 'seeing things'  . Naproxen Sodium   . Penicillins     REACTION: hives  . Sulfonamide Derivatives   . Thimerosal      Outpatient Prescriptions Prior to Visit  Medication Sig Dispense Refill  . albuterol (PROVENTIL HFA;VENTOLIN HFA) 108 (90 BASE) MCG/ACT inhaler Inhale 2 puffs into the lungs every 6 (six) hours as needed for wheezing.  1 Inhaler  2  . aspirin 81 MG EC tablet Take 81 mg by mouth daily.        . beta carotene w/minerals (OCUVITE) tablet Take 1 tablet by mouth daily.        . budesonide-formoterol (SYMBICORT) 80-4.5 MCG/ACT inhaler Inhale 2 puffs into the lungs 2 (two) times daily.  1 Inhaler  11  . hydrochlorothiazide 25 MG tablet Take 25 mg by mouth daily.        Marland Kitchen ibuprofen (ADVIL,MOTRIN) 100 MG tablet Take 100 mg by mouth every 6 (six) hours as needed.      Marland Kitchen levothyroxine (SYNTHROID, LEVOTHROID) 50 MCG tablet Take 50 mcg by mouth daily.        Marland Kitchen pyridoxine (B-6) 100 MG tablet Take 100 mg by mouth daily.        . vitamin B-12 (CYANOCOBALAMIN) 100 MCG tablet Take 100 mcg by mouth daily.        . Ascorbic Acid (VITAMIN C) 1000 MG tablet Take 2,000 mg by mouth daily.        . Calcium Carbonate-Vitamin D (CALCIUM + D) 600-200 MG-UNIT TABS Take 1 tablet by mouth 2 (two) times daily.        . fish oil-omega-3 fatty acids 1000 MG capsule Take 1 g by mouth 2 (two) times daily.        . magnesium 30 MG tablet Take 30 mg by mouth daily.      Marland Kitchen Respiratory Therapy Supplies (FLUTTER) DEVI 3 (three) times daily.         No facility-administered medications prior to visit.      Review of Systems  Constitutional:  Negative for fever and unexpected weight change.  HENT: Negative for congestion, dental problem, ear pain, nosebleeds, postnasal drip, rhinorrhea, sinus pressure, sneezing, sore throat and trouble swallowing.   Eyes: Negative for redness and itching.  Respiratory: Positive for shortness of breath. Negative for cough, chest tightness and wheezing.   Cardiovascular: Negative for palpitations and leg swelling.  Gastrointestinal: Negative for nausea and vomiting.  Genitourinary: Negative for dysuria.  Musculoskeletal: Negative for joint swelling.  Skin: Negative for rash.  Neurological: Negative for headaches.  Hematological: Does not bruise/bleed easily.  Psychiatric/Behavioral: Negative for dysphoric mood. The patient is not nervous/anxious.        Objective:   Physical Exam  Filed Vitals:   05/22/13 1349  BP: 132/66  Pulse: 100  Height: 4\' 9"  (1.448 m)  Weight: 99 lb (44.906 kg)  SpO2: 97%   RA  Gen: Elderly but very well appearing, no acute distress HEENT: NCAT, PERRL, EOMi, OP clear, neck supple without masses PULM: Diminished air flow bilaterally, no wheezing CV: RRR, systolic murmur in the right upper sternal border as well as a second louder holosystolic murmur at the apex which radiates to the left axilla, no JVD AB: BS+, soft, nontender, no hsm Ext: warm, no edema, no clubbing, no cyanosis Derm: no rash or skin breakdown Neuro: A&Ox4, CN II-XII intact, strength 5/5 in all 4 extremities  05/20/2013 chest x-ray reviewed> multiple calcified nodules throughout her lungs which are chronic in appearance. No significant pulmonary vascular hypertension or airspace disease fact appreciated no pleural effusion     Assessment & Plan:   Chronic airway obstruction, not elsewhere classified Today on physical exam she is not moving very much air into it sounds like she is a flare of her chronic obstructive airways disease.  I tried to encourage her today does use her inhalers on a  regular basis.  Plan:  -extend prednisone for 5 more days -Use Symbicort 2 puffs twice a day 07/20/2013 -Educated on proper use of albuterol -Educated on proper use of spacer -Followup in 2-3 months to see how things are going  DYSPNEA Think the most likely explanation for her shortness of breath today is her airflow obstruction. Her chest x-ray earlier this week did not show any significant abnormality.  However, she does have a loud murmur on physical exam which is consistent with mitral regurgitation. Previously she has been followed by my colleague Dr. [employer] for aortic stenosis. He 2011 echocardiogram noted mild to moderate mitral regurgitation. I suppose the MR could be worsening.  Plan: -Treat obstructive lung disease as above -Obtain echocardiogram -Cardiology consult depending on Results of echocardiogram..    Updated Medication List Outpatient Encounter Prescriptions as of 05/22/2013  Medication Sig  . albuterol (PROVENTIL HFA;VENTOLIN HFA) 108 (90 BASE) MCG/ACT inhaler Inhale 2 puffs into the lungs every 6 (six) hours as needed for wheezing.  07/22/2013 aspirin 81 MG EC tablet Take 81 mg by mouth daily.    . beta carotene w/minerals (OCUVITE) tablet Take 1 tablet by mouth daily.    . budesonide-formoterol (SYMBICORT) 80-4.5 MCG/ACT inhaler Inhale 2 puffs into the lungs 2 (two) times daily.  . hydrochlorothiazide 25 MG tablet Take 25 mg by mouth daily.    Marland Kitchen ibuprofen (ADVIL,MOTRIN) 100 MG tablet Take 100 mg by mouth every 6 (six) hours as needed.  Marland Kitchen levothyroxine (SYNTHROID, LEVOTHROID) 50 MCG tablet Take 50 mcg by mouth daily.    Marland Kitchen pyridoxine (B-6) 100 MG tablet Take 100 mg by mouth daily.    . vitamin B-12 (CYANOCOBALAMIN) 100 MCG tablet Take 100 mcg by mouth daily.    . [DISCONTINUED] Ascorbic Acid (VITAMIN C) 1000 MG tablet Take 2,000 mg by mouth daily.    . [DISCONTINUED] Calcium Carbonate-Vitamin D (CALCIUM + D) 600-200 MG-UNIT TABS Take 1 tablet by mouth 2 (two) times  daily.    . [  DISCONTINUED] fish oil-omega-3 fatty acids 1000 MG capsule Take 1 g by mouth 2 (two) times daily.    . [DISCONTINUED] magnesium 30 MG tablet Take 30 mg by mouth daily.  . [DISCONTINUED] Respiratory Therapy Supplies (FLUTTER) DEVI 3 (three) times daily.

## 2013-05-22 NOTE — Assessment & Plan Note (Signed)
Today on physical exam she is not moving very much air into it sounds like she is a flare of her chronic obstructive airways disease.  I tried to encourage her today does use her inhalers on a regular basis.  Plan:  -extend prednisone for 5 more days -Use Symbicort 2 puffs twice a day [employer] -Educated on proper use of albuterol -Educated on proper use of spacer -Followup in 2-3 months to see how things are going

## 2013-05-22 NOTE — Patient Instructions (Signed)
Take the symbicort two puffs twice a day  Use the albuterol 2 puffs every four hours as needed for shortness of breath We will arrange an echocardiogram We will see you back in 3 months or sooner if needed

## 2013-05-22 NOTE — Assessment & Plan Note (Signed)
Think the most likely explanation for her shortness of breath today is her airflow obstruction. Her chest x-ray earlier this week did not show any significant abnormality.  However, she does have a loud murmur on physical exam which is consistent with mitral regurgitation. Previously she has been followed by my colleague Dr. Shirlee Latch for aortic stenosis. He 2011 echocardiogram noted mild to moderate mitral regurgitation. I suppose the MR could be worsening.  Plan: -Treat obstructive lung disease as above -Obtain echocardiogram -Cardiology consult depending on Results of echocardiogram..

## 2013-05-24 ENCOUNTER — Telehealth: Payer: Self-pay | Admitting: Pulmonary Disease

## 2013-05-24 NOTE — Telephone Encounter (Signed)
Pt was seen 5.6.15 by BQ and was rx'd prednisone 10mg  1/2tab by mouth once daily.  Pt stated that she took her first dose yesterday morning 5.7.15 with breakfast.  She woke up at about 3am this morning with severe cramping in both feet and legs "the worst feeling you have ever dreamed of" and diarrhea.  Pt stated she has not taken any prednisone today and her symptoms are improved.  She is still having some occasional small loose bowels when she urinates and the cramping is "working itself out."  She is able to get around her home with no difficulties.  Pt denies any dyspnea, wheezing, chest tightness, chest pain, cough, congestion, vision changes, gait changes now or at the onset of her symptoms.  Pt is scheduled for her 2D Echo on 5.15.15 @ 1130.  She would like to not take any additional medications other than her already prescribed meds until this appt.  Dr [id number] please advise, thank you.

## 2013-05-24 NOTE — Telephone Encounter (Signed)
Fine by me 

## 2013-05-24 NOTE — Telephone Encounter (Signed)
Pt's daughter Elease Hashimoto returned call.  Advised that BQ okayed for pt to not take the prednisone as requested.  Pt's daughter verbalized her understanding and denied any questions/concerns at this time.  Prednisone has been added as an intolerance to pt's chart.

## 2013-05-24 NOTE — Telephone Encounter (Signed)
LMTCB

## 2013-05-31 ENCOUNTER — Other Ambulatory Visit (INDEPENDENT_AMBULATORY_CARE_PROVIDER_SITE_OTHER): Payer: Medicare Other

## 2013-05-31 ENCOUNTER — Other Ambulatory Visit: Payer: Self-pay

## 2013-05-31 ENCOUNTER — Telehealth: Payer: Self-pay | Admitting: Internal Medicine

## 2013-05-31 DIAGNOSIS — I359 Nonrheumatic aortic valve disorder, unspecified: Secondary | ICD-10-CM

## 2013-05-31 DIAGNOSIS — R0602 Shortness of breath: Secondary | ICD-10-CM

## 2013-05-31 DIAGNOSIS — R011 Cardiac murmur, unspecified: Secondary | ICD-10-CM

## 2013-05-31 MED ORDER — ALBUTEROL SULFATE HFA 108 (90 BASE) MCG/ACT IN AERS: 2.0000 | INHALATION_SPRAY | Freq: Four times a day (QID) | RESPIRATORY_TRACT | Status: DC | PRN

## 2013-05-31 NOTE — Telephone Encounter (Signed)
This has been e-scribed to the pharmacy.  Nothing further needed.

## 2013-05-31 NOTE — Telephone Encounter (Signed)
Pt daughter came into office today stating Denise Bell needs to get a refill on her albuterol (proventil QIW:[id number] hfa) 108 (90base) mcg/act inhaler  Her emergency inhaler. She only has about 18 puffs left Could dr Kendrick Fries call her in a rx for this [address] [place]

## 2013-06-04 ENCOUNTER — Telehealth: Payer: Self-pay | Admitting: Pulmonary Disease

## 2013-06-04 ENCOUNTER — Encounter: Payer: Self-pay | Admitting: Pulmonary Disease

## 2013-06-04 NOTE — Telephone Encounter (Signed)
Pt had ECHO done 05/31/13. Daughter is wanting results as pt does not hear well. Advised will message over to BQ advising on results. Please advise thanks

## 2013-06-04 NOTE — Telephone Encounter (Signed)
Please let her know that it showed severe narrowing of the aortic valve.  I want her to see cardiology again for this.  She previously saw Dr. Shirlee Latch

## 2013-06-05 NOTE — Telephone Encounter (Signed)
I called spoke with daughter and is aware of recs. She will call Dr. Alford Highland office for an appt. Nothing further needed

## 2013-06-13 ENCOUNTER — Ambulatory Visit (INDEPENDENT_AMBULATORY_CARE_PROVIDER_SITE_OTHER): Payer: Medicare Other | Admitting: Cardiovascular Disease

## 2013-06-13 ENCOUNTER — Encounter: Payer: Self-pay | Admitting: Cardiovascular Disease

## 2013-06-13 VITALS — BP 140/72 | HR 98 | Ht <= 58 in | Wt 96.0 lb

## 2013-06-13 DIAGNOSIS — I359 Nonrheumatic aortic valve disorder, unspecified: Secondary | ICD-10-CM

## 2013-06-13 DIAGNOSIS — R0602 Shortness of breath: Secondary | ICD-10-CM

## 2013-06-13 DIAGNOSIS — I10 Essential (primary) hypertension: Secondary | ICD-10-CM

## 2013-06-13 NOTE — Patient Instructions (Signed)
Continue same medications.   Your physician wants you to follow-up in: 6 months.  You will receive a reminder letter in the mail two months in advance. If you don't receive a letter, please call our office to schedule the follow-up appointment.  

## 2013-06-13 NOTE — Progress Notes (Signed)
Primary care physician: Dr. Einar Crow  HPI  This is a 80 year old female who was referred by Dr. Kendrick Fries for evaluation of severe aortic stenosis. She has known history of rheumatoid arthritis, localized pulmonary amyloidosis, Sjogren's syndrome, obstructive lung disease, and aortic stenosis presents . She was seen in the past by Dr. Shirlee Latch but none since 2011. Echocardiogram at that time showed mild to moderate aortic stenosis with normal ejection fraction. She was seen recently by Dr. Kendrick Fries for worsening dyspnea. She was treated for COPD. An echocardiogram was done which showed normal LV systolic function, moderate left ventricular hypertrophy, severely calcified aortic valve with severe stenosis, calcified mitral annulus with moderate to severe mitral regurgitation. Mean gradient across the aortic valve was 42 mm Hg with a valve area of 0.63 cm. She denies chest discomfort, dizziness or syncope. She lives alone and is relatively independent. There is no history of smoking or alcohol use. She is not diabetic. She is 4 feet 9 inches tall and weighs 96 pounds.   Allergies  Allergen Reactions  . Epinephrine   . Moxifloxacin     REACTION: hallucinations, confusions and 'seeing things'  . Naproxen Sodium   . Penicillins     REACTION: hives  . Prednisone     REACTION: severe leg cramps, diarrhea  . Sulfonamide Derivatives   . Thimerosal      Current Outpatient Prescriptions on File Prior to Visit  Medication Sig Dispense Refill  . albuterol (PROVENTIL HFA;VENTOLIN HFA) 108 (90 BASE) MCG/ACT inhaler Inhale 2 puffs into the lungs every 6 (six) hours as needed for wheezing.  1 Inhaler  11  . aspirin 81 MG EC tablet Take 81 mg by mouth daily.        . beta carotene w/minerals (OCUVITE) tablet Take 1 tablet by mouth daily.        . budesonide-formoterol (SYMBICORT) 80-4.5 MCG/ACT inhaler Inhale 2 puffs into the lungs 2 (two) times daily.  1 Inhaler  11  . hydrochlorothiazide 25 MG  tablet Take 25 mg by mouth daily.        Marland Kitchen ibuprofen (ADVIL,MOTRIN) 100 MG tablet Take 100 mg by mouth every 6 (six) hours as needed.      Marland Kitchen levothyroxine (SYNTHROID, LEVOTHROID) 50 MCG tablet Take 50 mcg by mouth daily.        . predniSONE (DELTASONE) 20 MG tablet Take 0.5 tablets (10 mg total) by mouth daily with breakfast.  5 tablet  0  . vitamin B-12 (CYANOCOBALAMIN) 100 MCG tablet Take 100 mcg by mouth daily.         No current facility-administered medications on file prior to visit.     Past Medical History  Diagnosis Date  . Rheumatoid arthritis(714.0)   . Sjogren's syndrome   . Hypertension   . Dyslipidemia     Very severe muscle soreness and wekness with use of Lipitor and will not take statin again  . Aortic stenosis     Mild to mod. Echo 5/10 with EF 55-65%, mild to mod AS with mean gradient 19 mmHg, mild LVH. Phsycial exam consistent with mild to mod AS. Initial echo 5/10 was read as "severe aortic stenosis" so cardio became involved. Echo 6/11 mild to mod AS and normal systolic func (no change). Reinterpretation of echo shows AS still mild to mod  . Pulmonary amyloidosis 1992    Localized, dx by lung bx at [facility]; This type of amyloidosis is isolated and should not affect the heart  . Obstructive lung  disease     Non smoker, 5/10 due to LPA; PFTs 06/03/08 reported mod obstruction w/ severe diffusion defect due to systs/cavitis of amyloidosis vs new asthma vs bronchiolitis obliterans of ra; 2 week pred trial; spirometry  . Chronic rhinitis   . Leg cramps 2010    Unrelated to pulmonary inhalers, tested and proven using Koch's postulate     Past Surgical History  Procedure Laterality Date  . Lung biopsy  1992    [facility]  . Tubal ligation    . Tonsillectomy       Family History  Problem Relation Age of Onset  . Heart attack Mother 34  . Heart attack Father 55  . Heart attack Sister   . Heart attack Brother 42  . Coronary artery disease Other   . Heart attack Brother  81  . Heart attack Sister      History   Social History  . Marital Status: Widowed    Spouse Name: N/A    Number of Children: N/A  . Years of Education: N/A   Occupational History  . Not on file.   Social History Main Topics  . Smoking status: Never Smoker   . Smokeless tobacco: Never Used  . Alcohol Use: No  . Drug Use: No  . Sexual Activity: Not on file   Other Topics Concern  . Not on file   Social History Narrative   Widowed   Lives alone but several children live near   Known as [employer] regular exercise     ROS A 10 point review of system was performed. It is negative other than that mentioned in the history of present illness.   PHYSICAL EXAM   BP 140/72  Pulse 98  Ht 4\' 9"  (1.448 m)  Wt 96 lb (43.545 kg)  BMI 20.77 kg/m2 Constitutional: She is oriented to person, place, and time. She appears well-developed and well-nourished. No distress.  HENT: No nasal discharge.  Head: Normocephalic and atraumatic.  Eyes: Pupils are equal and round. No discharge.  Neck: Normal range of motion. Neck supple. No JVD present. No thyromegaly present.  Cardiovascular: Normal rate, regular rhythm, S2 is diminished but not absent. Exam reveals no gallop and no friction rub. There is a 3/6 systolic ejection murmur at the aortic as well as left sternal border. Pulmonary/Chest: Effort normal and breath sounds normal. No stridor. No respiratory distress. She has no wheezes. She has no rales. She exhibits no tenderness.  Abdominal: Soft. Bowel sounds are normal. She exhibits no distension. There is no tenderness. There is no rebound and no guarding.  Musculoskeletal: Normal range of motion. She exhibits no edema and no tenderness.  Neurological: She is alert and oriented to person, place, and time. Coordination normal.  Skin: Skin is warm and dry. No rash noted. She is not diaphoretic. No erythema. No pallor.  Psychiatric: She has a normal mood and  affect. Her behavior is normal. Judgment and thought content normal.     EKG: Normal sinus rhythm with no significant ST or T wave changes.   ASSESSMENT AND PLAN

## 2013-06-15 NOTE — Assessment & Plan Note (Signed)
The patient has evidence of severe aortic valve stenosis with normal LV systolic function. Associated moderate to severe mitral regurgitation is likely due to a combination of calcified mitral apparatus as well as increased left ventricular pressure due to aortic stenosis. Current symptoms include dyspnea and possibly low-grade heart failure but no symptoms of angina or syncope. The dyspnea is likely multifactorial also given her lung disease.  I discussed the natural history and management of severe aortic stenosis. I explained the lack of definite medical therapy for this condition. I do not think she would be a good candidate for surgical aortic valve replacement. We discussed today the option of TAVR. I discussed the procedure in details and the pretesting required. After extensive discussion with the patient and family, they opted for conservative management which I think is reasonable given her age and relatively frail status. Continue current medications for now and followup in 6 months.

## 2013-06-15 NOTE — Assessment & Plan Note (Signed)
Blood pressure is reasonably controlled on hydrochlorothiazide. Avoid aggressive treatment of hypertension given the presence of severe aortic stenosis.

## 2013-12-05 ENCOUNTER — Ambulatory Visit (INDEPENDENT_AMBULATORY_CARE_PROVIDER_SITE_OTHER): Payer: Medicare Other | Admitting: Cardiovascular Disease

## 2013-12-05 ENCOUNTER — Encounter: Payer: Self-pay | Admitting: Cardiovascular Disease

## 2013-12-05 VITALS — BP 120/70 | HR 93 | Ht <= 58 in | Wt 97.0 lb

## 2013-12-05 DIAGNOSIS — I10 Essential (primary) hypertension: Secondary | ICD-10-CM

## 2013-12-05 DIAGNOSIS — I350 Nonrheumatic aortic (valve) stenosis: Secondary | ICD-10-CM

## 2013-12-05 DIAGNOSIS — J069 Acute upper respiratory infection, unspecified: Secondary | ICD-10-CM

## 2013-12-05 NOTE — Assessment & Plan Note (Addendum)
This is likely viral. Lung exam is unremarkable. I asked her to use Robitussin as needed. I instructed her to follow-up with primary care physician in few days if there is no improvement in symptoms.

## 2013-12-05 NOTE — Assessment & Plan Note (Signed)
The patient has evidence of severe aortic valve stenosis with normal LV systolic function. Given age  and frail status, She is being treated medically.

## 2013-12-05 NOTE — Assessment & Plan Note (Signed)
Blood pressure is controlled on current medications. 

## 2013-12-05 NOTE — Progress Notes (Signed)
Primary care physician: Dr. Einar Crow  HPI  This is a 80 year old female who is here today for follow-up visit regarding severe aortic stenosis. She has known history of rheumatoid arthritis, localized pulmonary amyloidosis, Sjogren's syndrome, obstructive lung disease, and aortic stenosis  Echocardiogram in 2011 showed mild to moderate aortic stenosis with normal ejection fraction. She was seen in 05/2013 for worsening dyspnea. She was treated for COPD. An echocardiogram was done which showed normal LV systolic function, moderate left ventricular hypertrophy, severely calcified aortic valve with severe stenosis, calcified mitral annulus with moderate to severe mitral regurgitation. Mean gradient across the aortic valve was 42 mm Hg with a valve area of 0.63 cm.   She is 4 feet 9 inches tall and weighs 96 pounds.  After extensive discussion with the patient and family, they elected against TAVR evaluation. She has been doing reasonably well. She started having cough productive of white sputum 3 days ago with no fevers or chills. She is having hard time bringing up the phlegm.   Allergies  Allergen Reactions  . Atorvastatin Other (See Comments)  . Epinephrine   . Ezetimibe Other (See Comments)  . Fluticasone Propionate Other (See Comments)  . Fluvastatin Other (See Comments)  . Moxifloxacin     REACTION: hallucinations, confusions and 'seeing things'  . Naproxen Sodium   . Penicillin V Potassium Hives  . Penicillins     REACTION: hives  . Prednisone     REACTION: severe leg cramps, diarrhea  . Quinolones Other (See Comments)  . Sulfa Antibiotics Nausea Only  . Sulfonamide Derivatives   . Thimerosal   . Lisinopril Rash     Current Outpatient Prescriptions on File Prior to Visit  Medication Sig Dispense Refill  . albuterol (PROVENTIL HFA;VENTOLIN HFA) 108 (90 BASE) MCG/ACT inhaler Inhale 2 puffs into the lungs every 6 (six) hours as needed for wheezing. 1 Inhaler 11  .  aspirin 81 MG EC tablet Take 325 mg by mouth daily.     . beta carotene w/minerals (OCUVITE) tablet Take 1 tablet by mouth daily.      . budesonide-formoterol (SYMBICORT) 80-4.5 MCG/ACT inhaler Inhale 2 puffs into the lungs 2 (two) times daily. 1 Inhaler 11  . hydrochlorothiazide 25 MG tablet Take 25 mg by mouth daily.      Marland Kitchen ibuprofen (ADVIL,MOTRIN) 100 MG tablet Take 100 mg by mouth every 6 (six) hours as needed.    Marland Kitchen levothyroxine (SYNTHROID, LEVOTHROID) 50 MCG tablet Take 50 mcg by mouth daily.      . vitamin B-12 (CYANOCOBALAMIN) 100 MCG tablet Take 100 mcg by mouth daily.       No current facility-administered medications on file prior to visit.     Past Medical History  Diagnosis Date  . Rheumatoid arthritis(714.0)   . Sjogren's syndrome   . Hypertension   . Dyslipidemia     Very severe muscle soreness and wekness with use of Lipitor and will not take statin again  . Aortic stenosis     Mild to mod. Echo 5/10 with EF 55-65%, mild to mod AS with mean gradient 19 mmHg, mild LVH. Phsycial exam consistent with mild to mod AS. Initial echo 5/10 was read as "severe aortic stenosis" so cardio became involved. Echo 6/11 mild to mod AS and normal systolic func (no change). Reinterpretation of echo shows AS still mild to mod  . Pulmonary amyloidosis 1992    Localized, dx by lung bx at [facility]; This type of amyloidosis is isolated and  should not affect the heart  . Obstructive lung disease     Non smoker, 5/10 due to LPA; PFTs 06/03/08 reported mod obstruction w/ severe diffusion defect due to systs/cavitis of amyloidosis vs new asthma vs bronchiolitis obliterans of ra; 2 week pred trial; spirometry  . Chronic rhinitis   . Leg cramps 2010    Unrelated to pulmonary inhalers, tested and proven using Koch's postulate     Past Surgical History  Procedure Laterality Date  . Lung biopsy  1992    [facility]  . Tubal ligation    . Tonsillectomy       Family History  Problem Relation Age of Onset    . Heart attack Mother 5  . Heart attack Father 64  . Heart attack Sister   . Heart attack Brother 42  . Coronary artery disease Other   . Heart attack Brother 81  . Heart attack Sister      History   Social History  . Marital Status: Widowed    Spouse Name: N/A    Number of Children: N/A  . Years of Education: N/A   Occupational History  . Not on file.   Social History Main Topics  . Smoking status: Never Smoker   . Smokeless tobacco: Never Used  . Alcohol Use: No  . Drug Use: No  . Sexual Activity: Not on file   Other Topics Concern  . Not on file   Social History Narrative   Widowed   Lives alone but several children live near   Known as [employer] regular exercise     ROS A 10 point review of system was performed. It is negative other than that mentioned in the history of present illness.   PHYSICAL EXAM   BP 120/70 mmHg  Pulse 93  Ht 4\' 9"  (1.448 m)  Wt 97 lb (43.999 kg)  BMI 20.98 kg/m2 Constitutional: She is oriented to person, place, and time. She appears well-developed and well-nourished. No distress.  HENT: No nasal discharge.  Head: Normocephalic and atraumatic.  Eyes: Pupils are equal and round. No discharge.  Neck: Normal range of motion. Neck supple. No JVD present. No thyromegaly present.  Cardiovascular: Normal rate, regular rhythm, S2 is diminished but not absent. Exam reveals no gallop and no friction rub. There is a 3/6 systolic ejection murmur at the aortic as well as left sternal border. Pulmonary/Chest: Effort normal and breath sounds normal. No stridor. No respiratory distress. She has no wheezes. She has no rales. She exhibits no tenderness.  Abdominal: Soft. Bowel sounds are normal. She exhibits no distension. There is no tenderness. There is no rebound and no guarding.  Musculoskeletal: Normal range of motion. She exhibits no edema and no tenderness.  Neurological: She is alert and oriented to person,  place, and time. Coordination normal.  Skin: Skin is warm and dry. No rash noted. She is not diaphoretic. No erythema. No pallor.  Psychiatric: She has a normal mood and affect. Her behavior is normal. Judgment and thought content normal.     EKG: Normal sinus rhythm with no significant ST or T wave changes. LVH.   ASSESSMENT AND PLAN

## 2013-12-05 NOTE — Patient Instructions (Signed)
Use Robitussin DM as needed for cough.   Your physician wants you to follow-up in: 6 months.  You will receive a reminder letter in the mail two months in advance. If you don't receive a letter, please call our office to schedule the follow-up appointment.

## 2013-12-08 ENCOUNTER — Inpatient Hospital Stay: Payer: Self-pay | Admitting: Internal Medicine

## 2013-12-08 LAB — COMPREHENSIVE METABOLIC PANEL
ALK PHOS: 66 U/L
ALT: 29 U/L
AST: 43 U/L — AB (ref 15–37)
Albumin: 3.2 g/dL — ABNORMAL LOW (ref 3.4–5.0)
Anion Gap: 7 (ref 7–16)
BUN: 7 mg/dL (ref 7–18)
Bilirubin,Total: 0.7 mg/dL (ref 0.2–1.0)
CHLORIDE: 86 mmol/L — AB (ref 98–107)
CREATININE: 0.54 mg/dL — AB (ref 0.60–1.30)
Calcium, Total: 8 mg/dL — ABNORMAL LOW (ref 8.5–10.1)
Co2: 29 mmol/L (ref 21–32)
EGFR (African American): >60
EGFR (Non-African Amer.): >60
Glucose: 94 mg/dL (ref 65–99)
Osmolality: 244 (ref 275–301)
Potassium: 4 mmol/L (ref 3.5–5.1)
SODIUM: 122 mmol/L — AB (ref 136–145)
Total Protein: 7.3 g/dL (ref 6.4–8.2)

## 2013-12-08 LAB — PRO B NATRIURETIC PEPTIDE: B-TYPE NATIURETIC PEPTID: 3708 pg/mL — AB (ref 0–450)

## 2013-12-08 LAB — OSMOLALITY: Osmolality: 248 mOsm/kg — CL (ref 280–301)

## 2013-12-08 LAB — CBC
HCT: 37.4 % (ref 35.0–47.0)
HGB: 12.5 g/dL (ref 12.0–16.0)
MCH: 27.5 pg (ref 26.0–34.0)
MCHC: 33.4 g/dL (ref 32.0–36.0)
MCV: 82 fL (ref 80–100)
Platelet: 257 10*3/uL (ref 150–440)
RBC: 4.54 10*6/uL (ref 3.80–5.20)
RDW: 13.8 % (ref 11.5–14.5)
WBC: 8.9 10*3/uL (ref 3.6–11.0)

## 2013-12-08 LAB — CK TOTAL AND CKMB (NOT AT ARMC)
CK, Total: 155 U/L (ref 26–192)
CK-MB: 5.4 ng/mL — ABNORMAL HIGH (ref 0.5–3.6)

## 2013-12-08 LAB — TROPONIN I: Troponin-I: <0.02 ng/mL

## 2013-12-09 LAB — BASIC METABOLIC PANEL
ANION GAP: 6 — AB (ref 7–16)
BUN: 6 mg/dL — AB (ref 7–18)
CALCIUM: 8 mg/dL — AB (ref 8.5–10.1)
CHLORIDE: 94 mmol/L — AB (ref 98–107)
Co2: 30 mmol/L (ref 21–32)
Creatinine: 0.6 mg/dL (ref 0.60–1.30)
EGFR (Non-African Amer.): >60
GLUCOSE: 108 mg/dL — AB (ref 65–99)
Osmolality: 259 (ref 275–301)
POTASSIUM: 3.8 mmol/L (ref 3.5–5.1)
SODIUM: 130 mmol/L — AB (ref 136–145)

## 2013-12-09 LAB — LIPID PANEL
Cholesterol: 152 mg/dL (ref 0–200)
HDL Cholesterol: 55 mg/dL (ref 40–60)
Ldl Cholesterol, Calc: 85 mg/dL (ref 0–100)
Triglycerides: 60 mg/dL (ref 0–200)
VLDL CHOLESTEROL, CALC: 12 mg/dL (ref 5–40)

## 2013-12-09 LAB — TSH: Thyroid Stimulating Horm: 0.54 u[IU]/mL

## 2013-12-10 ENCOUNTER — Telehealth: Payer: Self-pay | Admitting: Cardiovascular Disease

## 2013-12-10 LAB — CBC WITH DIFFERENTIAL/PLATELET
BASOS ABS: 0 10*3/uL (ref 0.0–0.1)
Basophil %: 0.1 %
Eosinophil #: 0 10*3/uL (ref 0.0–0.7)
Eosinophil %: 0 %
HCT: 36.1 % (ref 35.0–47.0)
HGB: 11.8 g/dL — AB (ref 12.0–16.0)
Lymphocyte #: 0.4 10*3/uL — ABNORMAL LOW (ref 1.0–3.6)
Lymphocyte %: 5.8 %
MCH: 27.5 pg (ref 26.0–34.0)
MCHC: 32.6 g/dL (ref 32.0–36.0)
MCV: 85 fL (ref 80–100)
MONO ABS: 0.1 x10 3/mm — AB (ref 0.2–0.9)
Monocyte %: 1.9 %
NEUTROS ABS: 5.7 10*3/uL (ref 1.4–6.5)
Neutrophil %: 92.2 %
Platelet: 272 10*3/uL (ref 150–440)
RBC: 4.28 10*6/uL (ref 3.80–5.20)
RDW: 13.5 % (ref 11.5–14.5)
WBC: 6.2 10*3/uL (ref 3.6–11.0)

## 2013-12-10 LAB — BASIC METABOLIC PANEL
Anion Gap: 7 (ref 7–16)
BUN: 10 mg/dL (ref 7–18)
CREATININE: 0.55 mg/dL — AB (ref 0.60–1.30)
Calcium, Total: 7.9 mg/dL — ABNORMAL LOW (ref 8.5–10.1)
Chloride: 99 mmol/L (ref 98–107)
Co2: 28 mmol/L (ref 21–32)
EGFR (African American): >60
EGFR (Non-African Amer.): >60
Glucose: 145 mg/dL — ABNORMAL HIGH (ref 65–99)
OSMOLALITY: 270 (ref 275–301)
POTASSIUM: 3.9 mmol/L (ref 3.5–5.1)
Sodium: 134 mmol/L — ABNORMAL LOW (ref 136–145)

## 2013-12-10 NOTE — Telephone Encounter (Signed)
Pt is in hospital now for sinus infection and it turned into bronchitis. Pt daughter is letting us know.

## 2013-12-11 LAB — BASIC METABOLIC PANEL
Anion Gap: 8 (ref 7–16)
BUN: 15 mg/dL (ref 7–18)
CHLORIDE: 94 mmol/L — AB (ref 98–107)
Calcium, Total: 8.4 mg/dL — ABNORMAL LOW (ref 8.5–10.1)
Co2: 26 mmol/L (ref 21–32)
Creatinine: 0.77 mg/dL (ref 0.60–1.30)
EGFR (Non-African Amer.): >60
GLUCOSE: 143 mg/dL — AB (ref 65–99)
OSMOLALITY: 260 (ref 275–301)
POTASSIUM: 4.4 mmol/L (ref 3.5–5.1)
Sodium: 128 mmol/L — ABNORMAL LOW (ref 136–145)

## 2013-12-11 LAB — OSMOLALITY, URINE: Osmolality: 259 mOsm/kg

## 2013-12-11 LAB — CHLORIDE, URINE, RANDOM: Chloride, Urine Random: <10 mmol/L — ABNORMAL LOW (ref 55–125)

## 2013-12-12 LAB — SODIUM, URINE, RANDOM: Sodium, Urine Random: 7 mmol/L (ref 20–110)

## 2013-12-12 LAB — BASIC METABOLIC PANEL
Anion Gap: 6 — ABNORMAL LOW (ref 7–16)
BUN: 18 mg/dL (ref 7–18)
CALCIUM: 8.1 mg/dL — AB (ref 8.5–10.1)
Chloride: 98 mmol/L (ref 98–107)
Co2: 27 mmol/L (ref 21–32)
Creatinine: 0.66 mg/dL (ref 0.60–1.30)
EGFR (Non-African Amer.): >60
GLUCOSE: 132 mg/dL — AB (ref 65–99)
Osmolality: 266 (ref 275–301)
Potassium: 4.3 mmol/L (ref 3.5–5.1)
Sodium: 131 mmol/L — ABNORMAL LOW (ref 136–145)

## 2013-12-12 LAB — CHLORIDE, URINE, RANDOM

## 2013-12-13 LAB — CBC WITH DIFFERENTIAL/PLATELET
BASOS PCT: 0.1 %
Basophil #: 0 10*3/uL (ref 0.0–0.1)
Eosinophil #: 0 10*3/uL (ref 0.0–0.7)
Eosinophil %: 0 %
HCT: 34.7 % — ABNORMAL LOW (ref 35.0–47.0)
HGB: 11.3 g/dL — ABNORMAL LOW (ref 12.0–16.0)
Lymphocyte #: 0.8 10*3/uL — ABNORMAL LOW (ref 1.0–3.6)
Lymphocyte %: 10.2 %
MCH: 27.4 pg (ref 26.0–34.0)
MCHC: 32.5 g/dL (ref 32.0–36.0)
MCV: 84 fL (ref 80–100)
MONOS PCT: 13 %
Monocyte #: 1.1 x10 3/mm — ABNORMAL HIGH (ref 0.2–0.9)
Neutrophil #: 6.3 10*3/uL (ref 1.4–6.5)
Neutrophil %: 76.7 %
Platelet: 270 10*3/uL (ref 150–440)
RBC: 4.12 10*6/uL (ref 3.80–5.20)
RDW: 13.6 % (ref 11.5–14.5)
WBC: 8.2 10*3/uL (ref 3.6–11.0)

## 2013-12-13 LAB — BASIC METABOLIC PANEL
Anion Gap: 3 — ABNORMAL LOW (ref 7–16)
BUN: 19 mg/dL — AB (ref 7–18)
Calcium, Total: 8.2 mg/dL — ABNORMAL LOW (ref 8.5–10.1)
Chloride: 98 mmol/L (ref 98–107)
Co2: 30 mmol/L (ref 21–32)
Creatinine: 0.7 mg/dL (ref 0.60–1.30)
GLUCOSE: 98 mg/dL (ref 65–99)
Osmolality: 265 (ref 275–301)
Potassium: 4.4 mmol/L (ref 3.5–5.1)
Sodium: 131 mmol/L — ABNORMAL LOW (ref 136–145)

## 2013-12-14 LAB — BASIC METABOLIC PANEL
Anion Gap: 6 — ABNORMAL LOW (ref 7–16)
BUN: 16 mg/dL (ref 7–18)
CALCIUM: 8 mg/dL — AB (ref 8.5–10.1)
CHLORIDE: 99 mmol/L (ref 98–107)
CREATININE: 0.61 mg/dL (ref 0.60–1.30)
Co2: 30 mmol/L (ref 21–32)
EGFR (African American): >60
Glucose: 86 mg/dL (ref 65–99)
OSMOLALITY: 271 (ref 275–301)
Potassium: 3.9 mmol/L (ref 3.5–5.1)
Sodium: 135 mmol/L — ABNORMAL LOW (ref 136–145)

## 2014-01-27 ENCOUNTER — Ambulatory Visit (INDEPENDENT_AMBULATORY_CARE_PROVIDER_SITE_OTHER)
Admission: RE | Admit: 2014-01-27 | Discharge: 2014-01-27 | Disposition: A | Discharge status: Home / Self Care | Payer: Medicare Other | Source: Ambulatory Visit | Attending: Pulmonary Disease | Admitting: Pulmonary Disease

## 2014-01-27 ENCOUNTER — Encounter: Payer: Self-pay | Admitting: Pulmonary Disease

## 2014-01-27 ENCOUNTER — Telehealth: Payer: Self-pay | Admitting: Pulmonary Disease

## 2014-01-27 ENCOUNTER — Other Ambulatory Visit (INDEPENDENT_AMBULATORY_CARE_PROVIDER_SITE_OTHER): Payer: Medicare Other

## 2014-01-27 ENCOUNTER — Ambulatory Visit (INDEPENDENT_AMBULATORY_CARE_PROVIDER_SITE_OTHER): Payer: Medicare Other | Admitting: Pulmonary Disease

## 2014-01-27 VITALS — BP 138/66 | HR 107 | Temp 97.1°F | Ht <= 58 in | Wt 94.0 lb

## 2014-01-27 DIAGNOSIS — R0602 Shortness of breath: Secondary | ICD-10-CM

## 2014-01-27 DIAGNOSIS — I350 Nonrheumatic aortic (valve) stenosis: Secondary | ICD-10-CM

## 2014-01-27 DIAGNOSIS — J441 Chronic obstructive pulmonary disease with (acute) exacerbation: Secondary | ICD-10-CM

## 2014-01-27 LAB — BRAIN NATRIURETIC PEPTIDE: Pro B Natriuretic peptide (BNP): 742 pg/mL — ABNORMAL HIGH (ref 0.0–100.0)

## 2014-01-27 MED ORDER — METHYLPREDNISOLONE 4 MG PO KIT: PACK | ORAL | Status: DC

## 2014-01-27 MED ORDER — DOXYCYCLINE HYCLATE 100 MG PO TABS: 100.0000 mg | ORAL_TABLET | Freq: Two times a day (BID) | ORAL | Status: DC

## 2014-01-27 NOTE — Telephone Encounter (Signed)
I called her to let her know that her Chest X-ray showed early signs of pneumonia so I called in doxycycline to her pharmacy.  She will need a repeat CXR on her next visit with me.

## 2014-01-27 NOTE — Patient Instructions (Signed)
Take the medrol dose pack as directed We will call you with the result of the Chest X-ray and blood test Take your symbicort with a spacer and the albuterol as well We will see you back in 4-6 weeks or sooner if needed

## 2014-01-27 NOTE — Assessment & Plan Note (Signed)
As noted above will obtain chest x-ray and proBNP to look for evidence of pulmonary edema.

## 2014-01-27 NOTE — Progress Notes (Signed)
Subjective:    Patient ID: Denise Bell, female    DOB: [birth year], 81 y.o.   MRN: [id number]   Synopsis: Denise Bell was first evaluated by the [facility] pulmonary clinic in may of 2015 after she been followed for several years in [place] at the [facility] pulmonary office. She has a very interesting pulmonary history in that she was diagnosed with localized pulmonary amyloidosis by open lung biopsy at [facility] in 1992. Starting around 2000 and then she developed increasing shortness of breath with airflow obstruction. She responded to treatment as if she had asthma. The thought was that she possibly had bronchiolitis obliterans versus new asthma. She also has mild aortic stenosis which been followed by Dr. Marca Ancona in [place].   HPI  Chief Complaint  Patient presents with  . Acute Visit    Pt c/o sob with any exertion, cold sweats, nonprod cough X3 days.     Shahd has been struggling to breathe in the last few weks.  She has been having sinus congestion adn drainage which drains into her lungs.  She has not had afever or chills.  She has been having problems with the sinus congestion for a week.  She has been having more trouble breathing because of it.  She has had some mild.  She has been hospitalized twice since her last visit with me, once for 9 days after some problem breathing.  She was treated with prednisone when this happened but she said that she had a lot of pain in her torsoe when this happened.  She said that it tok a long time to improve.  She has been using albuterol every 4-6 hours multiple times per day lately becaues of the dyspnea.  She has been using symbicort regularly.     Past Medical History  Diagnosis Date  . Rheumatoid arthritis(714.0)   . Sjogren's syndrome   . Hypertension   . Dyslipidemia     Very severe muscle soreness and wekness with use of Lipitor and will not take statin again  . Aortic stenosis    Mild to mod. Echo 5/10 with EF 55-65%, mild to mod AS with mean gradient 19 mmHg, mild LVH. Phsycial exam consistent with mild to mod AS. Initial echo 5/10 was read as "severe aortic stenosis" so cardio became involved. Echo 6/11 mild to mod AS and normal systolic func (no change). Reinterpretation of echo shows AS still mild to mod  . Pulmonary amyloidosis 1992    Localized, dx by lung bx at [facility]; This type of amyloidosis is isolated and should not affect the heart  . Obstructive lung disease     Non smoker, 5/10 due to LPA; PFTs 06/03/08 reported mod obstruction w/ severe diffusion defect due to systs/cavitis of amyloidosis vs new asthma vs bronchiolitis obliterans of ra; 2 week pred trial; spirometry  . Chronic rhinitis   . Leg cramps 2010    Unrelated to pulmonary inhalers, tested and proven using Koch's postulate         Review of Systems  Constitutional: Negative for fever and unexpected weight change.  HENT: Positive for postnasal drip, rhinorrhea and sinus pressure. Negative for congestion, dental problem, ear pain, nosebleeds, sneezing, sore throat and trouble swallowing.   Respiratory: Positive for cough and shortness of breath. Negative for chest tightness and wheezing.   Cardiovascular: Negative for palpitations and leg swelling.       Objective:   Physical Exam  Filed Vitals:  01/27/14 1115  BP: 138/66  Pulse: 107  Temp: 97.1 F (36.2 C)  TempSrc: Oral  Height: 4\' 9"  (1.448 m)  Weight: 94 lb (42.638 kg)  SpO2: 95%   RA  Gen: Elderly but very well appearing, no acute distress HEENT: NCAT, PERRL, EOMi, OP clear, neck supple without masses PULM: Diminished air flow bilaterally, no wheezing CV: RRR, systolic murmur in the right upper sternal border as well as a second louder holosystolic murmur at the apex which radiates to the left axilla, no JVD AB: BS+, soft, nontender, no hsm Ext: warm, no edema, no clubbing, no cyanosis Derm: no rash or skin breakdown Neuro:  A&Ox4, CN II-XII intact, strength 5/5 in all 4 extremities  05/20/2013 chest x-ray reviewed> multiple calcified nodules throughout her lungs which are chronic in appearance. No significant pulmonary vascular hypertension or airspace disease fact appreciated no pleural effusion     Assessment & Plan:   Decompensated COPD (chronic obstructive pulmonary disease) Sounds as if she is having another episode of COPD exacerbation, though I am concerned that she may have some pulmonary edema based on her physical exam. Because of her frequent exacerbations of COPD in the last 4 months we will treat as such while getting a chest x-ray to ensure there is nothing else going on.  There she notes moderate to severe symptoms she does not appear to be in marked distress today and should be able to be treated as an outpatient successfully.  Today I advised that she switched to nebulized bronchodilators because I'm not sure that her drug delivery is very appropriate with HFA inhalers. She was not interested in this because she says she is afraid of having a machine like that in her home. I tried to explain the simplicity of it but she would have nothing of it.  Plan:  -Solu-Medrol Dosepak -Use Symbicort with a spacer device -Continue albuterol as needed -Chest x-ray and proBNP, may need diuretic if these are suggestive of pulmonary edema -Follow-up 4 weeks, advised to call 07/20/2013 or go to hospital if no improvement.    Updated Medication List Outpatient Encounter Prescriptions as of 01/27/2014  Medication Sig  . albuterol (PROVENTIL HFA;VENTOLIN HFA) 108 (90 BASE) MCG/ACT inhaler Inhale 2 puffs into the lungs every 6 (six) hours as needed for wheezing.  03/28/2014 ALPRAZolam (XANAX) 0.5 MG tablet Take 0.5 mg by mouth daily as needed for anxiety.  Marland Kitchen aspirin 81 MG EC tablet Take 325 mg by mouth daily.   . beta carotene w/minerals (OCUVITE) tablet Take 1 tablet by mouth daily.    . budesonide-formoterol (SYMBICORT) 80-4.5  MCG/ACT inhaler Inhale 2 puffs into the lungs 2 (two) times daily.  . hydrochlorothiazide 25 MG tablet Take 25 mg by mouth daily.    Marland Kitchen levothyroxine (SYNTHROID, LEVOTHROID) 50 MCG tablet Take 50 mcg by mouth daily.    . Magnesium (M2 MAGNESIUM) 100 MG CAPS Take 100 mg by mouth daily.  . vitamin B-12 (CYANOCOBALAMIN) 100 MCG tablet Take 100 mcg by mouth daily.    . methylPREDNISolone (MEDROL DOSEPAK) 4 MG tablet follow package directions  . [DISCONTINUED] ibuprofen (ADVIL,MOTRIN) 100 MG tablet Take 100 mg by mouth every 6 (six) hours as needed.

## 2014-01-27 NOTE — Assessment & Plan Note (Signed)
Sounds as if she is having another episode of COPD exacerbation, though I am concerned that she may have some pulmonary edema based on her physical exam. Because of her frequent exacerbations of COPD in the last 4 months we will treat as such while getting a chest x-ray to ensure there is nothing else going on.  There she notes moderate to severe symptoms she does not appear to be in marked distress today and should be able to be treated as an outpatient successfully.  Today I advised that she switched to nebulized bronchodilators because I'm not sure that her drug delivery is very appropriate with HFA inhalers. She was not interested in this because she says she is afraid of having a machine like that in her home. I tried to explain the simplicity of it but she would have nothing of it.  Plan:  -Solu-Medrol Dosepak -Use Symbicort with a spacer device -Continue albuterol as needed -Chest x-ray and proBNP, may need diuretic if these are suggestive of pulmonary edema -Follow-up 4 weeks, advised to call us or go to hospital if no improvement.

## 2014-01-29 ENCOUNTER — Telehealth: Payer: Self-pay

## 2014-01-29 NOTE — Telephone Encounter (Signed)
Spoke with pt, states she feels like she is getting stronger, is taking her medications as prescribed.  States that eating with her medications keeps her from getting nauseous with her prednisone and abx.  Fyi.

## 2014-01-29 NOTE — Telephone Encounter (Signed)
-----   Message from Lupita Leash, MD sent at 01/27/2014  6:06 PM EST ----- A, Please call her on Wednesday Jan 13 to make sure she is getting better and let me know how she is doing.  I will be working nights so I will check the note that night. Thanks B

## 2014-01-30 NOTE — Telephone Encounter (Signed)
thanks

## 2014-02-20 ENCOUNTER — Ambulatory Visit: Payer: Self-pay | Admitting: Pulmonary Disease

## 2014-02-20 ENCOUNTER — Encounter: Payer: Self-pay | Admitting: Pulmonary Disease

## 2014-02-20 ENCOUNTER — Ambulatory Visit (INDEPENDENT_AMBULATORY_CARE_PROVIDER_SITE_OTHER): Payer: Medicare Other | Admitting: Pulmonary Disease

## 2014-02-20 VITALS — BP 116/62 | HR 94 | Ht <= 58 in | Wt 93.0 lb

## 2014-02-20 DIAGNOSIS — R059 Cough, unspecified: Secondary | ICD-10-CM

## 2014-02-20 DIAGNOSIS — J441 Chronic obstructive pulmonary disease with (acute) exacerbation: Secondary | ICD-10-CM

## 2014-02-20 DIAGNOSIS — R05 Cough: Secondary | ICD-10-CM

## 2014-02-20 NOTE — Patient Instructions (Signed)
Use the Symbicort Coupon, let us know if it does not work for you We will call you with the results of the Chest X-ray Follow the directions on the lifestyle modification sheet  We will see you back in 3 months or sooner if needed

## 2014-02-20 NOTE — Progress Notes (Signed)
Subjective:    Patient ID: Denise Bell, female    DOB: September 10, 1919, 81 y.o.   MRN: [id number]   Synopsis: Denise Bell was first evaluated by the [facility] pulmonary clinic in may of 2015 after she been followed for several years in [place] at the [facility] pulmonary office. She has a very interesting pulmonary history in that she was diagnosed with localized pulmonary amyloidosis by open lung biopsy at [facility] in 1992. Starting around 2000 and then she developed increasing shortness of breath with airflow obstruction. She responded to treatment as if she had asthma. The thought was that she possibly had bronchiolitis obliterans versus new asthma. She also has mild aortic stenosis which been followed by Dr. Marca Ancona in [place].   HPI  Chief Complaint  Patient presents with  . Follow-up    Pt states her breathing is stable as long as she uses her symbicort qam and qhs, and uses her proair around 11 and 5 daily.     Denise Bell says that she is feeling better since the last visit.  She took the solumedrol and the antibiotic.  She has not had any cough or mucus production.  She is not having shortness of breath now and she says that she is using the symbicort regularly since the last visit.     Past Medical History  Diagnosis Date  . Rheumatoid arthritis(714.0)   . Sjogren's syndrome   . Hypertension   . Dyslipidemia     Very severe muscle soreness and wekness with use of Lipitor and will not take statin again  . Aortic stenosis     Mild to mod. Echo 5/10 with EF 55-65%, mild to mod AS with mean gradient 19 mmHg, mild LVH. Phsycial exam consistent with mild to mod AS. Initial echo 5/10 was read as "severe aortic stenosis" so cardio became involved. Echo 6/11 mild to mod AS and normal systolic func (no change). Reinterpretation of echo shows AS still mild to mod  . Pulmonary amyloidosis 1992    Localized, dx by lung bx at [facility]; This type of  amyloidosis is isolated and should not affect the heart  . Obstructive lung disease     Non smoker, 5/10 due to LPA; PFTs 06/03/08 reported mod obstruction w/ severe diffusion defect due to systs/cavitis of amyloidosis vs new asthma vs bronchiolitis obliterans of ra; 2 week pred trial; spirometry  . Chronic rhinitis   . Leg cramps 2010    Unrelated to pulmonary inhalers, tested and proven using Koch's postulate         Review of Systems  Constitutional: Negative for fever and unexpected weight change.  HENT: Negative for congestion, dental problem, ear pain, nosebleeds, postnasal drip, rhinorrhea, sinus pressure, sneezing, sore throat and trouble swallowing.   Respiratory: Negative for cough, chest tightness, shortness of breath, wheezing and stridor.   Cardiovascular: Negative for palpitations and leg swelling.       Objective:   Physical Exam  Filed Vitals:   02/20/14 1016  BP: 116/62  Pulse: 94  Height: 4\' 9"  (1.448 m)  Weight: 93 lb (42.185 kg)  SpO2: 95%   RA  Gen: Elderly but very well appearing, no acute distress HEENT: NCAT, PERRL, EOMi, OP clear, neck supple without masses PULM: Diminished air flow bilaterally, no wheezing CV: RRR, systolic murmur in the right upper sternal border as well as a second louder holosystolic murmur at the apex which radiates to the left axilla, no JVD  AB: BS+, soft, nontender, no hsm Ext: warm, no edema, no clubbing, no cyanosis Derm: no rash or skin breakdown Neuro: A&Ox4, CN II-XII intact, strength 5/5 in all 4 extremities  05/20/2013 chest x-ray reviewed> multiple calcified nodules throughout her lungs which are chronic in appearance. No significant pulmonary vascular hypertension or airspace disease fact appreciated no pleural effusion     Assessment & Plan:   AMYLOIDOSIS UNSPECIFIED Denise Bell has recovered from a recent episode of severe asthma versus COPD. Her chest x-ray showed chronic changes consistent with her known  pulmonary amyloidosis and perhaps a slight area of atelectasis versus pneumonia in the left lung. She is feeling much better now.  Plan:  -Continue Symbicort twice a day -Repeat chest x-ray to ensure that the area of pneumonia has resolved -Follow-up 3 months   Decompensated COPD (chronic obstructive pulmonary disease) As above continue Symbicort twice a day. She would like a coupon for this because her insurance company does not cover it anymore. We may need to switch to an alternative agent if the coupon does not work at her pharmacy.     Updated Medication List Outpatient Encounter Prescriptions as of 02/20/2014  Medication Sig  . albuterol (PROVENTIL HFA;VENTOLIN HFA) 108 (90 BASE) MCG/ACT inhaler Inhale 2 puffs into the lungs every 6 (six) hours as needed for wheezing.  Marland Kitchen aspirin 81 MG EC tablet Take 325 mg by mouth daily.   . beta carotene w/minerals (OCUVITE) tablet Take 1 tablet by mouth daily.    . budesonide-formoterol (SYMBICORT) 80-4.5 MCG/ACT inhaler Inhale 2 puffs into the lungs 2 (two) times daily.  . hydrochlorothiazide 25 MG tablet Take 25 mg by mouth daily.    Marland Kitchen levothyroxine (SYNTHROID, LEVOTHROID) 50 MCG tablet Take 50 mcg by mouth daily.    . Magnesium (M2 MAGNESIUM) 100 MG CAPS Take 100 mg by mouth daily.  . vitamin B-12 (CYANOCOBALAMIN) 100 MCG tablet Take 100 mcg by mouth daily.    . [DISCONTINUED] ALPRAZolam (XANAX) 0.5 MG tablet Take 0.5 mg by mouth daily as needed for anxiety.  . [DISCONTINUED] doxycycline (VIBRA-TABS) 100 MG tablet Take 1 tablet (100 mg total) by mouth 2 (two) times daily.  . [DISCONTINUED] methylPREDNISolone (MEDROL DOSEPAK) 4 MG tablet follow package directions

## 2014-02-20 NOTE — Assessment & Plan Note (Signed)
As above continue Symbicort twice a day. She would like a coupon for this because her insurance company does not cover it anymore. We may need to switch to an alternative agent if the coupon does not work at her pharmacy.

## 2014-02-20 NOTE — Assessment & Plan Note (Signed)
Denise Bell has recovered from a recent episode of severe asthma versus COPD. Her chest x-ray showed chronic changes consistent with her known pulmonary amyloidosis and perhaps a slight area of atelectasis versus pneumonia in the left lung. She is feeling much better now.  Plan:  -Continue Symbicort twice a day -Repeat chest x-ray to ensure that the area of pneumonia has resolved -Follow-up 3 months

## 2014-02-24 ENCOUNTER — Telehealth: Payer: Self-pay | Admitting: Internal Medicine

## 2014-02-24 NOTE — Telephone Encounter (Signed)
LMTCB x 1 

## 2014-02-24 NOTE — Telephone Encounter (Signed)
Pt returned call. Pt requesting CXR results she had at [facility]. Results not in Epic.   Dr. Henrene Pastor please advise.

## 2014-02-26 NOTE — Telephone Encounter (Signed)
Pt aware of results. Nothing more needed at this time.

## 2014-02-26 NOTE — Telephone Encounter (Signed)
A, Please let her know that her CXR was OK Thanks B

## 2014-02-27 ENCOUNTER — Telehealth: Payer: Self-pay

## 2014-02-27 NOTE — Telephone Encounter (Signed)
Pt aware of cxr results  

## 2014-02-27 NOTE — Telephone Encounter (Signed)
-----   Message from Lupita Leash, MD sent at 02/26/2014  5:48 PM EST ----- A, Please let her know that her CXR was OK Thanks B

## 2014-02-28 ENCOUNTER — Telehealth: Payer: Self-pay | Admitting: Pulmonary Disease

## 2014-02-28 MED ORDER — BUDESONIDE-FORMOTEROL FUMARATE 80-4.5 MCG/ACT IN AERO: 2.0000 | INHALATION_SPRAY | Freq: Two times a day (BID) | RESPIRATORY_TRACT | Status: DC

## 2014-02-28 NOTE — Telephone Encounter (Signed)
Pt is aware that Rx has been sent to pharmacy (confirmed) on file. She will take her coupon from BQ visit recently with her to the pharmacy. Nothing more needed at this time.

## 2014-05-10 NOTE — Discharge Summary (Signed)
PATIENT NAME:  Denise Bell, Denise Bell MR#:  [id number] DATE OF BIRTH:  04/09/1919  DATE OF ADMISSION:  12/08/2013 DATE OF DISCHARGE:  12/16/2013  DISCHARGE DIAGNOSES: 1.  Chronic obstructive pulmonary disease flare causing hypoxia, acute on chronic respiratory failure.  2.  Hyponatremia from volume decrease and restricted diet. 3.  Nosebleeds, improved.  4.  Side pain, improved.  5.  Generalized weakness secondary to all of the above and age of 18 with inability to ambulate safely.   DISCHARGE MEDICATIONS: Per [facility] med reconciliation. Please see for details. Briefly, she is on prednisone taper, some nebulized bronchodilators. She will be on as needed alprazolam and standing mirtazapine 7.5 mg at night. Otherwise, her usual oral medication routine with the exception of hydrochlorothiazide given the hyponatremia.   HISTORY AND PHYSICAL: Please see detailed history and physical done on admission.   HOSPITAL COURSE: The patient was admitted with above, coughing and wheezing, improved with bronchodilators and steroids. She was given IV normal saline with hyponatremia. That initially improved, worsened again when the fluids were decreased and then improved with a bolus. Continuing to be off of hydrochlorothiazide until her diet liberalized. Renal consult saw her and helped with this as well. The last sodium was on the 28th and it was 135. CBC was normal on that date as well. She is now off of oxygen, 94% saturation of oxygen on room air. Urine, chloride and sodium were appropriately low with her hyponatremia. Urine osmolality was 259, which was appropriately dilute as well. We are trying to obtain a skilled nursing facility, rehabilitation, temporary stay, waiting on her insurance company, given all the above. Hopefully, it will be done soon.    TIME SPENT: It took approximately 35 minutes to do all discharge tasks today.   ____________________________ Marya Amsler. Dareen Piano, MD mwa:sb D: 12/16/2013 08:16:18  ET T: 12/16/2013 08:26:01 ET JOB#: [id number]  cc: Marya Amsler. Dareen Piano, MD, <Dictator> [name] MD ELECTRONICALLY SIGNED 12/16/2013 14:30

## 2014-05-10 NOTE — H&P (Signed)
PATIENT NAME:  KAGAN, HIETPAS MR#:  [id number] DATE OF BIRTH:  09-29-1919  PRIMARY CARE PROVIDER:  Marya Amsler. Dareen Piano, MD, [facility]  REFERRING DOCTOR:  Dr. Derrill Kay.   CHIEF COMPLAINT: Shortness of breath, cough, wheezing.   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 According to her son, she has these episodes mainly on Sunday because he feels that these are anxiety related when the family is visiting her. The patient initially when she arrived was very short of breath and tachycardic and tachypneic, had to be placed on 15 liters O2, but while on nonrebreather the patient's saturations were 87%. She received breathing treatment and her oxygen saturations improved, her breathing improved.  Now she is on 2 liters of oxygen.    The patient reports that she has been having cough productive over the past 5 days. It is whitish in color.  Also has been having significant wheezing and shortness of breath initially with exertion, now at rest. Has not had any fevers or chills. No chest pains. No palpitations. No nausea, vomiting, or diarrhea.   PAST MEDICAL HISTORY: Significant for: 1.  Hypothyroidism.  2.  Amyloidosis.  3.  Hypertension.   PAST SURGICAL HISTORY: Tonsillectomy, bilateral tubal ligation, appendectomy, hernia surgery.   ALLERGIES: ALEVE, BACITRACIN, PENICILLIN, PHENYLPROPANOLAMINE HYDROCHLORIDE, RESTASIS, STATINS, SULFA DRUGS. SHE ALSO REPORTS THAT EVERY TIME SHE TAKES STEROID SHE STARTS HAVING VISUAL HALLUCINATIONS AND GETS VERY SICK, IS UNABLE TO TAKE ANY STEROIDS DUE TO THAT FACT.   MEDICATIONS AT HOME:  Bayer aspirin 81 one tab p.o. daily, hydrochlorothiazide 25 p.o. daily, Symbicort 80/4.5 one puff b.i.d., Synthroid 50 mcg daily, ProAir 2 puffs q. 6 p.r.n., vitamin B12 500 mcg  daily, vitamin B6 daily, vitamin C daily, Ocuvite 1 tab p.o. daily.   SOCIAL HISTORY: No smoking. No alcohol or drug use. Lives alone.   FAMILY HISTORY: Positive for hypertension.    REVIEW OF SYSTEMS:  CONSTITUTIONAL: Denies any fevers, fatigue, no weight loss, no weight gain.  EYES: No blurred or double vision. No redness.  EARS, NOSE, THROAT: No tinnitus. No ear pain. No seasonal or year-round allergies. Has chronic hearing loss.  RESPIRATORY: Complains of cough, wheezing. No hemoptysis. Complains of dyspnea.  CARDIOVASCULAR: Denies any chest pain, orthopnea, edema, or arrhythmia.  GASTROINTESTINAL: No nausea, vomiting, diarrhea. No abdominal pain. No hematemesis, no melena.   GENITOURINARY: Denies any dysuria, hematuria, renal calculi, or frequency.  ENDOCRINE: Denies any polyuria, nocturia. Has hypothyroidism. HEMATOLOGIC AND LYMPHATIC:  Denies any easy bruisability or bleeding.  SKIN: No acne. No rash.  MUSCULOSKELETAL: No pain in neck, back, or shoulder.  NEUROLOGIC: No numbness, CVA, TIA or seizures.  PSYCHIATRIC:  There is no anxiety, insomnia, or ADD.  PHYSICAL EXAMINATION: VITAL SIGNS: Temperature 98, pulse 98, respirations 16, blood pressure 152/88, O2 of 87% on room air.  GENERAL: The patient is a thin, Caucasian female in no acute distress.  HEENT: Head atraumatic, normocephalic. Pupils equally round, reactive to light and accommodation. There is no nasal drainage or ulceration. External ear exam shows no erythema or drainage.  NECK: Supple without any thyromegaly.  CARDIOVASCULAR: Regular rate and rhythm. No murmurs, rubs, clicks, or gallops. NECK: Supple without any JVD, no thyromegaly.  LUNGS: Has mild accessory muscle usage. Has bilateral wheezing throughout both lungs. There is no rhonchi.  ABDOMEN: Soft, nontender, nondistended. Positive bowel sounds x 4.  EXTREMITIES: No clubbing, cyanosis, or edema.   SKIN: No rash.  LYMPH NODES: Nonpalpable.  VASCULAR: Good  DP, PT pulses.  PSYCHIATRIC: Not anxious or depressed.  NEUROLOGIC:  Awake, alert, oriented x 3.   LABORATORY DATA: Glucose 94, BUN 7, creatinine 0.54. Sodium was 122, potassium 4.0, calcium 8.0. LFTs are normal except albumin of 3.2. AST 43. CPK is 155, CK-MB 5.4. Troponin less than 0.02. WBC 8.9, hemoglobin 12.5, platelets 257,000.   IMAGING:  Chest x-ray shows an enlarged cardiac silhouette, COPD changes with chronic interstitial disease and bibasilar atelectasis and scarring, calcified pulmonary nodules grossly unchanged.   ASSESSMENT AND PLAN: The patient is a 81 year old white female with history of amyloidosis involving lung, presents with shortness of breath, wheezing, initially placed on high flow oxygen, now on 2 liters. 1.  Acute bronchospasm, likely related to amyloidosis of the lung as well as acute bronchitis. At this time, we will go ahead and place her on nebulizers every 6 hours. Unfortunately, she is not able to take steroids, so I will not be able to give her steroids.  Place her on p.o. Levaquin, Mucinex and continue oxygen.   2.  Hypertension.  I am going to hold her hydrochlorothiazide in light of her sodium being low. 3.  Hypothyroidism. Continue Synthroid.  4.  Hyponatremia, possibly related to her lung disease with syndrome of inappropriate antidiuretic hormone (secretion). Also could be related to hydrochlorothiazide therapy. I will hold hydrochlorothiazide therapy. Follow her sodium.  5.  Miscellaneous. She will be on heparin for deep vein thrombosis prophylaxis.  Fifty minutes spent on this patient.    ____________________________ Lacie Scotts. Allena Katz, MD shp:LT D: 12/08/2013 16:04:00 ET T: 12/08/2013 16:23:01 ET JOB#: [id number]  cc: Iqra Rotundo H. Allena Katz, MD, <Dictator> Charise Carwin MD ELECTRONICALLY SIGNED 12/15/2013 13:40

## 2014-06-06 ENCOUNTER — Encounter: Payer: Self-pay | Admitting: Cardiovascular Disease

## 2014-06-06 ENCOUNTER — Ambulatory Visit (INDEPENDENT_AMBULATORY_CARE_PROVIDER_SITE_OTHER): Payer: Medicare Other | Admitting: Cardiovascular Disease

## 2014-06-06 VITALS — BP 122/60 | HR 89 | Ht <= 58 in | Wt 90.8 lb

## 2014-06-06 DIAGNOSIS — I10 Essential (primary) hypertension: Secondary | ICD-10-CM

## 2014-06-06 DIAGNOSIS — M542 Cervicalgia: Secondary | ICD-10-CM | POA: Diagnosis not present

## 2014-06-06 DIAGNOSIS — I350 Nonrheumatic aortic (valve) stenosis: Secondary | ICD-10-CM

## 2014-06-06 NOTE — Patient Instructions (Signed)
Medication Instructions: Continue same medications.   Labwork: None.   Procedures/Testing: None.   Follow-Up: 6 months with Dr. Mccartney Chuba.   Any Additional Special Instructions Will Be Listed Below (If Applicable).   

## 2014-06-06 NOTE — Assessment & Plan Note (Signed)
The patient has evidence of severe aortic valve stenosis with normal LV systolic function. Given age  and frail status, She is being treated medically. She appears to be symptomatically stable with no convincing symptoms of angina, heart failure or syncope.

## 2014-06-06 NOTE — Progress Notes (Signed)
Primary care physician: Dr. Einar Crow  HPI  This is a 81 year old female who is here today for follow-up visit regarding severe aortic stenosis. She has known history of rheumatoid arthritis, localized pulmonary amyloidosis, Sjogren's syndrome, obstructive lung disease, and aortic stenosis.  Echocardiogram in 2011 showed mild to moderate aortic stenosis with normal ejection fraction. She was seen in 05/2013 for worsening dyspnea. She was treated for COPD. An echocardiogram was done which showed normal LV systolic function, moderate left ventricular hypertrophy, severely calcified aortic valve with severe stenosis, calcified mitral annulus with moderate to severe mitral regurgitation. Mean gradient across the aortic valve was 42 mm Hg with a valve area of 0.63 cm.   She is 4 feet 9 inches tall and weighs 96 pounds.  After extensive discussion with the patient and family, they elected against TAVR evaluation. She was hospitalized in December for bronchitis and was discharged to a nursing home. She had issues with recurrent bronchitis but this seems to be improving. She is currently at home. She has been doing reasonably well and denies any chest tightness. No dizziness or syncope. Dyspnea is stable.   Allergies  Allergen Reactions  . Atorvastatin Other (See Comments)  . Epinephrine   . Ezetimibe Other (See Comments)  . Fluticasone Propionate Other (See Comments)  . Fluvastatin Other (See Comments)  . Moxifloxacin     REACTION: hallucinations, confusions and 'seeing things'  . Naproxen Sodium   . Penicillin V Potassium Hives  . Penicillins     REACTION: hives  . Prednisone     REACTION: severe leg cramps, diarrhea  . Quinolones Other (See Comments)  . Sulfa Antibiotics Nausea Only  . Sulfonamide Derivatives   . Thimerosal   . Lisinopril Rash     Current Outpatient Prescriptions on File Prior to Visit  Medication Sig Dispense Refill  . albuterol (PROVENTIL HFA;VENTOLIN HFA)  108 (90 BASE) MCG/ACT inhaler Inhale 2 puffs into the lungs every 6 (six) hours as needed for wheezing. 1 Inhaler 11  . aspirin 81 MG EC tablet Take 325 mg by mouth daily.     . beta carotene w/minerals (OCUVITE) tablet Take 1 tablet by mouth daily.      . budesonide-formoterol (SYMBICORT) 80-4.5 MCG/ACT inhaler Inhale 2 puffs into the lungs 2 (two) times daily. 1 Inhaler 11  . hydrochlorothiazide 25 MG tablet Take 25 mg by mouth daily.      Marland Kitchen levothyroxine (SYNTHROID, LEVOTHROID) 50 MCG tablet Take 50 mcg by mouth daily.      . Magnesium (M2 MAGNESIUM) 100 MG CAPS Take 100 mg by mouth daily.    . vitamin B-12 (CYANOCOBALAMIN) 100 MCG tablet Take 100 mcg by mouth daily.       No current facility-administered medications on file prior to visit.     Past Medical History  Diagnosis Date  . Rheumatoid arthritis(714.0)   . Sjogren's syndrome   . Hypertension   . Dyslipidemia     Very severe muscle soreness and wekness with use of Lipitor and will not take statin again  . Aortic stenosis     Mild to mod. Echo 5/10 with EF 55-65%, mild to mod AS with mean gradient 19 mmHg, mild LVH. Phsycial exam consistent with mild to mod AS. Initial echo 5/10 was read as "severe aortic stenosis" so cardio became involved. Echo 6/11 mild to mod AS and normal systolic func (no change). Reinterpretation of echo shows AS still mild to mod  . Pulmonary amyloidosis 1992  Localized, dx by lung bx at [facility]; This type of amyloidosis is isolated and should not affect the heart  . Obstructive lung disease     Non smoker, 5/10 due to LPA; PFTs 06/03/08 reported mod obstruction w/ severe diffusion defect due to systs/cavitis of amyloidosis vs new asthma vs bronchiolitis obliterans of ra; 2 week pred trial; spirometry  . Chronic rhinitis   . Leg cramps 2010    Unrelated to pulmonary inhalers, tested and proven using Koch's postulate     Past Surgical History  Procedure Laterality Date  . Lung biopsy  1992    [facility]    . Tubal ligation    . Tonsillectomy       Family History  Problem Relation Age of Onset  . Heart attack Mother 65  . Heart attack Father 20  . Heart attack Sister   . Heart attack Brother 42  . Coronary artery disease Other   . Heart attack Brother 81  . Heart attack Sister      History   Social History  . Marital Status: Widowed    Spouse Name: N/A  . Number of Children: N/A  . Years of Education: N/A   Occupational History  . Not on file.   Social History Main Topics  . Smoking status: Never Smoker   . Smokeless tobacco: Never Used  . Alcohol Use: No  . Drug Use: No  . Sexual Activity: Not on file   Other Topics Concern  . Not on file   Social History Narrative   Widowed   Lives alone but several children live near   Known as Denise Bell of [employer] regular exercise     ROS A 10 point review of system was performed. It is negative other than that mentioned in the history of present illness.   PHYSICAL EXAM   BP 122/60 mmHg  Pulse 89  Ht 4' 7.5" (1.41 m)  Wt 90 lb 12 oz (41.164 kg)  BMI 20.71 kg/m2 Constitutional: She is oriented to person, place, and time. She appears well-developed and well-nourished. No distress.  HENT: No nasal discharge.  Head: Normocephalic and atraumatic.  Eyes: Pupils are equal and round. No discharge.  Neck: Normal range of motion. Neck supple. No JVD present. No thyromegaly present.  Cardiovascular: Normal rate, regular rhythm, S2 is diminished but not absent. Exam reveals no gallop and no friction rub. There is a 3/6 systolic ejection murmur at the aortic as well as left sternal border. Pulmonary/Chest: Effort normal and breath sounds normal. No stridor. No respiratory distress. She has no wheezes. She has no rales. She exhibits no tenderness.  Abdominal: Soft. Bowel sounds are normal. She exhibits no distension. There is no tenderness. There is no rebound and no guarding.  Musculoskeletal: Normal range of  motion. She exhibits no edema and no tenderness.  Neurological: She is alert and oriented to person, place, and time. Coordination normal.  Skin: Skin is warm and dry. No rash noted. She is not diaphoretic. No erythema. No pallor.  Psychiatric: She has a normal mood and affect. Her behavior is normal. Judgment and thought content normal.     EKG: Normal sinus rhythm with no significant ST or T wave changes. LVH.   ASSESSMENT AND PLAN

## 2014-06-06 NOTE — Assessment & Plan Note (Signed)
Blood pressure is well controlled on hydrochlorothiazide.

## 2014-06-11 ENCOUNTER — Telehealth: Payer: Self-pay | Admitting: Pulmonary Disease

## 2014-06-11 MED ORDER — ALBUTEROL SULFATE HFA 108 (90 BASE) MCG/ACT IN AERS: 2.0000 | INHALATION_SPRAY | Freq: Four times a day (QID) | RESPIRATORY_TRACT | Status: DC | PRN

## 2014-06-11 NOTE — Telephone Encounter (Signed)
rx refilled for rescue inhaler.  Pharmacist aware.  Nothing further needed.

## 2014-06-19 ENCOUNTER — Encounter: Payer: Self-pay | Admitting: Pulmonary Disease

## 2014-06-19 ENCOUNTER — Ambulatory Visit (INDEPENDENT_AMBULATORY_CARE_PROVIDER_SITE_OTHER): Payer: Medicare Other | Admitting: Pulmonary Disease

## 2014-06-19 ENCOUNTER — Ambulatory Visit: Payer: Medicare Other | Admitting: Pulmonary Disease

## 2014-06-19 VITALS — BP 114/66 | HR 83 | Ht <= 58 in | Wt 90.0 lb

## 2014-06-19 DIAGNOSIS — J441 Chronic obstructive pulmonary disease with (acute) exacerbation: Secondary | ICD-10-CM

## 2014-06-19 DIAGNOSIS — E854 Organ-limited amyloidosis: Secondary | ICD-10-CM

## 2014-06-19 DIAGNOSIS — E858 Other amyloidosis: Secondary | ICD-10-CM | POA: Diagnosis not present

## 2014-06-19 DIAGNOSIS — J99 Respiratory disorders in diseases classified elsewhere: Secondary | ICD-10-CM

## 2014-06-19 NOTE — Assessment & Plan Note (Signed)
Denise Bell has an interesting diagnosis of amyloidosis of the lung which manifests itself essentially as COPD. See details above.

## 2014-06-19 NOTE — Patient Instructions (Signed)
Keep taking your medications as you are doing We will see you back in 6 months or sooner if needed 

## 2014-06-19 NOTE — Assessment & Plan Note (Signed)
This has been a stable interval for Denise Bell as she has not had an exacerbation of COPD. She does well when taking Symbicort. Today we discussed giving the Prevnar vaccine, she wants to think about it for a while first. We spent a lengthy amount of time discussing the risks and benefits. I recommended that she take it.  Plan: Flu shot in the fall Return to clinic in 6 months and consider Prevnar vaccine then Continue Symbicort 2 puffs twice a day and when necessary Pro Air Refills provided

## 2014-06-19 NOTE — Progress Notes (Signed)
Subjective:    Patient ID: Denise Bell, female    DOB: 02/24/1919, 81 y.o.   MRN: [id number]   Synopsis: Denise Bell was first evaluated by the [facility] pulmonary clinic in may of 2015 after she been followed for several years in [place] at the [facility] pulmonary office. She has a very interesting pulmonary history in that she was diagnosed with localized pulmonary amyloidosis by open lung biopsy at [facility] in 1992. Starting around 2000 and then she developed increasing shortness of breath with airflow obstruction. She responded to treatment as if she had asthma. The thought was that she possibly had bronchiolitis obliterans versus new asthma. She also has mild aortic stenosis which been followed by Dr. Marca Ancona in [place].   HPI  Chief Complaint  Patient presents with  . Follow-up    pt states she breathes well as long as she stays compliant with her inhalers.  Pt still using Proair bid.     Denise Bell says she has been doing OK.  She says that the symbicort is working well.  She says she only has dyspnea when she exerts herself too much.  She says that even walking uphill has been OK however.  No cough.  She takes Symbicort 2 puffs bid and pro Air about once per day.  She has some clear sinus drainage but it is not too bad right now.    Past Medical History  Diagnosis Date  . Rheumatoid arthritis(714.0)   . Sjogren's syndrome   . Hypertension   . Dyslipidemia     Very severe muscle soreness and wekness with use of Lipitor and will not take statin again  . Aortic stenosis     Mild to mod. Echo 5/10 with EF 55-65%, mild to mod AS with mean gradient 19 mmHg, mild LVH. Phsycial exam consistent with mild to mod AS. Initial echo 5/10 was read as "severe aortic stenosis" so cardio became involved. Echo 6/11 mild to mod AS and normal systolic func (no change). Reinterpretation of echo shows AS still mild to mod  . Pulmonary amyloidosis  1992    Localized, dx by lung bx at [facility]; This type of amyloidosis is isolated and should not affect the heart  . Obstructive lung disease     Non smoker, 5/10 due to LPA; PFTs 06/03/08 reported mod obstruction w/ severe diffusion defect due to systs/cavitis of amyloidosis vs new asthma vs bronchiolitis obliterans of ra; 2 week pred trial; spirometry  . Chronic rhinitis   . Leg cramps 2010    Unrelated to pulmonary inhalers, tested and proven using Koch's postulate      Review of Systems  Constitutional: Negative for fever and unexpected weight change.  HENT: Negative for congestion, dental problem, ear pain, nosebleeds, postnasal drip, rhinorrhea, sinus pressure, sneezing, sore throat and trouble swallowing.   Respiratory: Negative for cough, chest tightness, shortness of breath, wheezing and stridor.   Cardiovascular: Negative for palpitations and leg swelling.       Objective:   Physical Exam  Filed Vitals:   06/19/14 1339  BP: 114/66  Pulse: 83  Height: 4\' 9"  (1.448 m)  Weight: 90 lb (40.824 kg)  SpO2: 99%   RA  Gen: Elderly but very well appearing, no acute distress HEENT: NCAT, PERRL, EOMi, OP clear, neck supple without masses PULM: Diminished air flow bilaterally, no wheezing CV: RRR, systolic murmur in the right upper sternal border as well as a second louder holosystolic murmur  at the apex which radiates to the left axilla, no JVD AB: BS+, soft, nontender, no hsm Ext: warm, no edema, no clubbing, no cyanosis Derm: no rash or skin breakdown Neuro: A&Ox4, CN II-XII intact, strength 5/5 in all 4 extremities  05/20/2013 chest x-ray reviewed> multiple calcified nodules throughout her lungs which are chronic in appearance. No significant pulmonary vascular hypertension or airspace disease fact appreciated no pleural effusion     Assessment & Plan:   COPD This has been a stable interval for Denise Bell as she has not had an exacerbation of COPD. She does well when taking  Symbicort. Today we discussed giving the Prevnar vaccine, she wants to think about it for a while first. We spent a lengthy amount of time discussing the risks and benefits. I recommended that she take it.  Plan: Flu shot in the fall Return to clinic in 6 months and consider Prevnar vaccine then Continue Symbicort 2 puffs twice a day and when necessary Pro Air Refills provided   Amyloidosis of lung Denise Bell has an interesting diagnosis of amyloidosis of the lung which manifests itself essentially as COPD. See details above.     Updated Medication List Outpatient Encounter Prescriptions as of 06/19/2014  Medication Sig  . albuterol (PROVENTIL HFA;VENTOLIN HFA) 108 (90 BASE) MCG/ACT inhaler Inhale 2 puffs into the lungs every 6 (six) hours as needed for wheezing.  Marland Kitchen aspirin 81 MG EC tablet Take 325 mg by mouth daily.   . beta carotene w/minerals (OCUVITE) tablet Take 1 tablet by mouth daily.    . budesonide-formoterol (SYMBICORT) 80-4.5 MCG/ACT inhaler Inhale 2 puffs into the lungs 2 (two) times daily.  . hydrochlorothiazide 25 MG tablet Take 25 mg by mouth daily.    Marland Kitchen levothyroxine (SYNTHROID, LEVOTHROID) 50 MCG tablet Take 50 mcg by mouth daily.    . Magnesium (M2 MAGNESIUM) 100 MG CAPS Take 100 mg by mouth daily.  . vitamin B-12 (CYANOCOBALAMIN) 100 MCG tablet Take 100 mcg by mouth daily.     No facility-administered encounter medications on file as of 06/19/2014.

## 2014-11-24 ENCOUNTER — Ambulatory Visit: Payer: Medicare Other | Admitting: Pulmonary Disease

## 2014-12-02 ENCOUNTER — Ambulatory Visit (INDEPENDENT_AMBULATORY_CARE_PROVIDER_SITE_OTHER): Payer: Medicare Other | Admitting: Pulmonary Disease

## 2014-12-02 ENCOUNTER — Encounter: Payer: Self-pay | Admitting: Pulmonary Disease

## 2014-12-02 VITALS — BP 124/78 | HR 89 | Ht <= 58 in | Wt 90.8 lb

## 2014-12-02 DIAGNOSIS — E858 Other amyloidosis: Secondary | ICD-10-CM | POA: Diagnosis not present

## 2014-12-02 DIAGNOSIS — R918 Other nonspecific abnormal finding of lung field: Secondary | ICD-10-CM | POA: Diagnosis not present

## 2014-12-02 DIAGNOSIS — J449 Chronic obstructive pulmonary disease, unspecified: Secondary | ICD-10-CM | POA: Diagnosis not present

## 2014-12-02 DIAGNOSIS — E854 Organ-limited amyloidosis: Secondary | ICD-10-CM

## 2014-12-02 DIAGNOSIS — J99 Respiratory disorders in diseases classified elsewhere: Secondary | ICD-10-CM

## 2014-12-03 NOTE — Progress Notes (Signed)
Previously followed by Dr Kendrick Fries for COPD with PMH of pulmonary nodules proven by biopsy to be pulmonary amyloidosis. She presents today for routine F/U. SHe has no new complaints and overall is very active and vibrant. She uses Symbicort BD and also albuterol MDI at breakfast and lunch. This is a regimen that has worked well for her for years. She denies CP, cough, sputum, hemoptysis, LE edema and calf tendetness. She has had no fevers or constitutional symptoms  Filed Vitals:   12/02/14 1120  BP: 124/78  Pulse: 89  Height: 4\' 7"  (1.397 m)  Weight: 90 lb 12.8 oz (41.187 kg)  SpO2: 97%   NAD  HEENT WNL NO JVD No adventitious sounds RRR s M NABS, soft No C/C/E  I have reviewed prior CXRs and CT chest  IMPRESSION: Pulmonary amyloidosis - comparison of chst films from 2010 to 2016 reveal no discernible progression of nodular densities COPD - well controlled  PLAN/REC: Cont current regimen of Symbicort and albuterol as above F/U in 6 months or sooner PRN  2017, MD PCCM service Mobile [phone] Pager [phone]

## 2014-12-16 ENCOUNTER — Ambulatory Visit (INDEPENDENT_AMBULATORY_CARE_PROVIDER_SITE_OTHER): Payer: Medicare Other | Admitting: Cardiovascular Disease

## 2014-12-16 ENCOUNTER — Encounter: Payer: Self-pay | Admitting: Cardiovascular Disease

## 2014-12-16 VITALS — BP 136/70 | HR 72 | Ht <= 58 in | Wt 91.2 lb

## 2014-12-16 DIAGNOSIS — I350 Nonrheumatic aortic (valve) stenosis: Secondary | ICD-10-CM | POA: Diagnosis not present

## 2014-12-16 DIAGNOSIS — I10 Essential (primary) hypertension: Secondary | ICD-10-CM

## 2014-12-16 NOTE — Assessment & Plan Note (Signed)
Blood pressure is controlled on hydrochlorothiazide. 

## 2014-12-16 NOTE — Progress Notes (Signed)
Primary care physician: Dr. Einar Crow  HPI  This is a 81 year old female who is here today for follow-up visit regarding severe aortic stenosis. She has known history of rheumatoid arthritis, localized pulmonary amyloidosis, Sjogren's syndrome, obstructive lung disease, and aortic stenosis.  Echocardiogram in 2011 showed mild to moderate aortic stenosis with normal ejection fraction. She was seen in 05/2013 for worsening dyspnea. She was treated for COPD. An echocardiogram was done which showed normal LV systolic function, moderate left ventricular hypertrophy, severely calcified aortic valve with severe stenosis, calcified mitral annulus with moderate to severe mitral regurgitation. Mean gradient across the aortic valve was 42 mm Hg with a valve area of 0.63 cm.   She is 4 feet 9 inches tall and weighs 96 pounds.  After extensive discussion with the patient and family, they elected against TAVR evaluation. She has been doing well since most recent hospital admission with no chest pain, dizziness or syncope. She reports stable exertional dyspnea. She is complaining of a runny nose with no productive cough. No fevers or chills.   Allergies  Allergen Reactions  . Atorvastatin Other (See Comments)  . Epinephrine   . Ezetimibe Other (See Comments)  . Fluticasone Propionate Other (See Comments)  . Fluvastatin Other (See Comments)  . Moxifloxacin     REACTION: hallucinations, confusions and 'seeing things'  . Naproxen Sodium   . Penicillin V Potassium Hives  . Penicillins     REACTION: hives  . Prednisone     REACTION: severe leg cramps, diarrhea  . Quinolones Other (See Comments)  . Sulfa Antibiotics Nausea Only  . Sulfonamide Derivatives   . Thimerosal   . Lisinopril Rash     Current Outpatient Prescriptions on File Prior to Visit  Medication Sig Dispense Refill  . albuterol (PROAIR HFA) 108 (90 BASE) MCG/ACT inhaler Inhale into the lungs.    Marland Kitchen aspirin 81 MG EC tablet Take  325 mg by mouth daily.     . beta carotene w/minerals (OCUVITE) tablet Take 1 tablet by mouth daily.      . budesonide-formoterol (SYMBICORT) 80-4.5 MCG/ACT inhaler Inhale 2 puffs into the lungs 2 (two) times daily. 1 Inhaler 11  . hydrochlorothiazide 25 MG tablet Take 25 mg by mouth daily.      Marland Kitchen levothyroxine (SYNTHROID, LEVOTHROID) 50 MCG tablet Take 50 mcg by mouth daily.      . Magnesium (M2 MAGNESIUM) 100 MG CAPS Take 100 mg by mouth daily.    . vitamin B-12 (CYANOCOBALAMIN) 100 MCG tablet Take 100 mcg by mouth daily.       No current facility-administered medications on file prior to visit.     Past Medical History  Diagnosis Date  . Rheumatoid arthritis(714.0)   . Sjogren's syndrome (HCC)   . Hypertension   . Dyslipidemia     Very severe muscle soreness and wekness with use of Lipitor and will not take statin again  . Aortic stenosis     Mild to mod. Echo 5/10 with EF 55-65%, mild to mod AS with mean gradient 19 mmHg, mild LVH. Phsycial exam consistent with mild to mod AS. Initial echo 5/10 was read as "severe aortic stenosis" so cardio became involved. Echo 6/11 mild to mod AS and normal systolic func (no change). Reinterpretation of echo shows AS still mild to mod  . Pulmonary amyloidosis (HCC) 1992    Localized, dx by lung bx at [facility]; This type of amyloidosis is isolated and should not affect the heart  . Obstructive  lung disease (HCC)     Non smoker, 5/10 due to LPA; PFTs 06/03/08 reported mod obstruction w/ severe diffusion defect due to systs/cavitis of amyloidosis vs new asthma vs bronchiolitis obliterans of ra; 2 week pred trial; spirometry  . Chronic rhinitis   . Leg cramps 2010    Unrelated to pulmonary inhalers, tested and proven using Koch's postulate     Past Surgical History  Procedure Laterality Date  . Lung biopsy  1992    [facility]  . Tubal ligation    . Tonsillectomy       Family History  Problem Relation Age of Onset  . Heart attack Mother 6  . Heart  attack Father 25  . Heart attack Sister   . Heart attack Brother 42  . Coronary artery disease Other   . Heart attack Brother 81  . Heart attack Sister      Social History   Social History  . Marital Status: Widowed    Spouse Name: N/A  . Number of Children: N/A  . Years of Education: N/A   Occupational History  . Not on file.   Social History Main Topics  . Smoking status: Never Smoker   . Smokeless tobacco: Never Used  . Alcohol Use: No  . Drug Use: No  . Sexual Activity: Not on file   Other Topics Concern  . Not on file   Social History Narrative   Widowed   Lives alone but several children live near   Known as [employer] regular exercise     ROS A 10 point review of system was performed. It is negative other than that mentioned in the history of present illness.   PHYSICAL EXAM   BP 136/70 mmHg  Pulse 72  Ht 4\' 7"  (1.397 m)  Wt 91 lb 4 oz (41.391 kg)  BMI 21.21 kg/m2 Constitutional: She is oriented to person, place, and time. She appears well-developed and well-nourished. No distress.  HENT: No nasal discharge.  Head: Normocephalic and atraumatic.  Eyes: Pupils are equal and round. No discharge.  Neck: Normal range of motion. Neck supple. No JVD present. No thyromegaly present.  Cardiovascular: Normal rate, regular rhythm, S2 is diminished but not absent. Exam reveals no gallop and no friction rub. There is a 3/6 systolic ejection murmur at the aortic as well as left sternal border. Pulmonary/Chest: Effort normal and breath sounds normal. No stridor. No respiratory distress. She has no wheezes. She has no rales. She exhibits no tenderness.  Abdominal: Soft. Bowel sounds are normal. She exhibits no distension. There is no tenderness. There is no rebound and no guarding.  Musculoskeletal: Normal range of motion. She exhibits no edema and no tenderness.  Neurological: She is alert and oriented to person, place, and time.  Coordination normal.  Skin: Skin is warm and dry. No rash noted. She is not diaphoretic. No erythema. No pallor.  Psychiatric: She has a normal mood and affect. Her behavior is normal. Judgment and thought content normal.       ASSESSMENT AND PLAN

## 2014-12-16 NOTE — Patient Instructions (Signed)
Medication Instructions: Continue same medications.   Labwork: None.   Procedures/Testing: None.   Follow-Up: 6 months.   Any Additional Special Instructions Will Be Listed Below (If Applicable).  You can use Benadryl as needed for runny nose.

## 2014-12-16 NOTE — Assessment & Plan Note (Signed)
The patient has evidence of severe aortic valve stenosis with normal LV systolic function. Given age  and frail status, She is being treated medically.  She appears to be stable. I advised her to avoid using decongestants for runny nose. She continues antihistamines as needed.

## 2015-02-03 ENCOUNTER — Telehealth: Payer: Self-pay | Admitting: *Deleted

## 2015-02-03 NOTE — Telephone Encounter (Signed)
[facility] is now in her chart as the preferred pharmacy for medication refills.

## 2015-02-03 NOTE — Telephone Encounter (Signed)
Pharmacy updated. Nothing further needed 

## 2015-02-03 NOTE — Telephone Encounter (Signed)
Pt just calling to update her Pharmacy for both [facility] and Lung doctor.  It will now be [facility] no longer [facility]  Please call if we need to ask her any questions.

## 2015-02-13 ENCOUNTER — Telehealth: Payer: Self-pay | Admitting: Pulmonary Disease

## 2015-02-13 NOTE — Telephone Encounter (Signed)
Patient insurance bcbs needs prior auth/ medical necessity  from albuterol rx.  [phone] tele  Please  Call daughter if needing to discuss.

## 2015-02-20 NOTE — Telephone Encounter (Signed)
Spoke with Barth Kirks at [facility]  She said that she will fax over a form for Dr. Sung Amabile to fill out for the Albuterol (ProAir) Awaiting fax.

## 2015-02-25 NOTE — Telephone Encounter (Signed)
Received form this am. Will fill form out and have DS sign.

## 2015-02-26 NOTE — Telephone Encounter (Signed)
Form fax to [facility]. Will await response.

## 2015-02-27 NOTE — Telephone Encounter (Signed)
Received form back from [facility] stating pt wasn't with [employer] so I submitted to [facility]. Key: [id number]. Will await response.

## 2015-03-02 NOTE — Telephone Encounter (Signed)
Received an approval for [employer]. Approved until 02/27/2016. Daughter informed. Nothing further needed.

## 2015-03-10 ENCOUNTER — Other Ambulatory Visit: Payer: Self-pay | Admitting: Pulmonary Disease

## 2015-03-10 ENCOUNTER — Other Ambulatory Visit: Payer: Self-pay

## 2015-03-10 MED ORDER — BUDESONIDE-FORMOTEROL FUMARATE 80-4.5 MCG/ACT IN AERO: 2.0000 | INHALATION_SPRAY | Freq: Two times a day (BID) | RESPIRATORY_TRACT | Status: AC

## 2015-05-25 ENCOUNTER — Telehealth: Payer: Self-pay | Admitting: Pulmonary Disease

## 2015-05-25 NOTE — Telephone Encounter (Signed)
Pt calling stating she has have SOB going on for about a week Waking up at night 4 am due to not being able to breath well. Would like to know what to do about this Please advise Would like to come in sooner, but i did not see anything sooner.

## 2015-05-25 NOTE — Telephone Encounter (Signed)
Spoke with pt and she states she is having more SOB and would like to be seen. Has gotten worse over last 2 weeks. appt scheduled with KK on 05/26/15 @ 10:30am. Pt will call back if she can't get a ride. Nothing further needed.

## 2015-05-26 ENCOUNTER — Ambulatory Visit (INDEPENDENT_AMBULATORY_CARE_PROVIDER_SITE_OTHER): Payer: Medicare Other | Admitting: Internal Medicine

## 2015-05-26 ENCOUNTER — Encounter: Payer: Self-pay | Admitting: Internal Medicine

## 2015-05-26 VITALS — BP 128/66 | HR 90 | Ht <= 58 in | Wt 86.0 lb

## 2015-05-26 DIAGNOSIS — R0602 Shortness of breath: Secondary | ICD-10-CM

## 2015-05-26 NOTE — Patient Instructions (Signed)

## 2015-05-26 NOTE — Progress Notes (Signed)
CC chronic SOB  HPI:  Previously followed by Dr Kendrick Fries for COPD with PMH of pulmonary nodules proven by biopsy to be pulmonary amyloidosis. Patient wakes up at night with SOB-She presents today for chronic SOB/DOEroutine F/U.  SHe has no new complaints and overall is very active and vibrant. She uses Symbicort BD and also albuterol MDI at breakfast and lunch.  -this regimen that has worked well for her for years. She denies CP, cough, sputum, hemoptysis, LE edema and calf tendetness. She has had no fevers or constitutional symptoms  Filed Vitals:   05/26/15 1026  BP: 128/66  Pulse: 90  Height: 4\' 9"  (1.448 m)  Weight: 86 lb (39.009 kg)  SpO2: 98%    Review of Systems  Constitutional: Negative for fever, chills, weight loss, malaise/fatigue and diaphoresis.  HENT: Negative for congestion.   Respiratory: Positive for shortness of breath. Negative for cough, hemoptysis, sputum production and wheezing.   Cardiovascular: Negative for chest pain, palpitations, orthopnea and leg swelling.  Gastrointestinal: Negative for heartburn, nausea and vomiting.  Musculoskeletal: Positive for neck pain.  Skin: Negative for rash.  Neurological: Negative for dizziness and headaches.  Psychiatric/Behavioral: Negative for depression. The patient is not nervous/anxious.   All other systems reviewed and are negative.   NAD  HEENT WNL NO JVD, no stridor CTA b/l no wheezes, rhonchi RRR s M     IMPRESSION: 82 yo white female with COPD and Pulm Amyloidosis with chronic SOB Pulmonary amyloidosis - comparison of chst films from 2010 to 2016 reveal no discernible progression of nodular densities COPD - well controlled  PLAN/REC: Cont current regimen of Symbicort and albuterol as above -check ONO and 2017 to assess oxygen needs   The Patient requires high complexity decision making for assessment and support, frequent evaluation and titration of therapies.  Patient satisfied with Plan of action  and management. All questions answered  , M.D.  Lucie Leather Pulmonary [facility] Medicine  Medical Director [facility] Medical Director [facility] Cardio-Pulmonary Department

## 2015-05-28 ENCOUNTER — Encounter: Payer: Self-pay | Admitting: Internal Medicine

## 2015-05-29 ENCOUNTER — Telehealth: Payer: Self-pay | Admitting: *Deleted

## 2015-05-29 NOTE — Telephone Encounter (Signed)
Pt's daughter Kelli Churn, informed of ONO results which show that pt does not need O2 at night.

## 2015-06-03 ENCOUNTER — Ambulatory Visit (INDEPENDENT_AMBULATORY_CARE_PROVIDER_SITE_OTHER): Payer: Medicare Other | Admitting: *Deleted

## 2015-06-03 DIAGNOSIS — R0600 Dyspnea, unspecified: Secondary | ICD-10-CM

## 2015-06-03 NOTE — Progress Notes (Signed)
SMW performed today. 

## 2015-06-05 ENCOUNTER — Ambulatory Visit (INDEPENDENT_AMBULATORY_CARE_PROVIDER_SITE_OTHER): Payer: Medicare Other | Admitting: Cardiovascular Disease

## 2015-06-05 ENCOUNTER — Encounter: Payer: Self-pay | Admitting: Cardiovascular Disease

## 2015-06-05 VITALS — BP 126/62 | HR 97 | Ht <= 58 in | Wt 87.0 lb

## 2015-06-05 DIAGNOSIS — I350 Nonrheumatic aortic (valve) stenosis: Secondary | ICD-10-CM | POA: Diagnosis not present

## 2015-06-05 MED ORDER — METOPROLOL SUCCINATE ER 25 MG PO TB24: 25.0000 mg | ORAL_TABLET | Freq: Every day | ORAL | Status: DC

## 2015-06-05 MED ORDER — FUROSEMIDE 20 MG PO TABS: 20.0000 mg | ORAL_TABLET | Freq: Every day | ORAL | Status: DC

## 2015-06-05 NOTE — Patient Instructions (Signed)
Medication Instructions:  Your physician has recommended you make the following change in your medication:  1. Discontinue HCTZ 2.Toprol 25 mg Once Daily 3. Lasix 20 mg Once Daily    Labwork: None ordered  Testing/Procedures: None ordered  Follow-Up: Your physician recommends that you schedule a follow-up appointment in: 1 month with Dr. Kirke Corin  Date & Time: _____________________________________________________________  You have been referred to [facility] [phone]    Any Other Special Instructions Will Be Listed Below (If Applicable).     If you need a refill on your cardiac medications before your next appointment, please call your pharmacy.

## 2015-06-05 NOTE — Progress Notes (Signed)
Cardiology Office Note   Date:  06/05/2015   ID:  Denise Bell, DOB 1919/09/03, MRN [id number]  PCP:  [name]., MD  Cardiologist:   Lorine Bears, MD   No chief complaint on file.     History of Present Illness: Denise Bell is a 82 y.o. female who presents for a follow-up visit regarding severe aortic stenosis. She has known history of rheumatoid arthritis, localized pulmonary amyloidosis, Sjogren's syndrome, obstructive lung disease, and aortic stenosis.  Echocardiogram in 2011 showed mild to moderate aortic stenosis with normal ejection fraction. She was seen in 05/2013 for worsening dyspnea. She was treated for COPD. An echocardiogram was done which showed normal LV systolic function, moderate left ventricular hypertrophy, severely calcified aortic valve with severe stenosis, calcified mitral annulus with moderate to severe mitral regurgitation. Mean gradient across the aortic valve was 42 mm Hg with a valve area of 0.63 cm.  After extensive discussion with the patient and family, they elected against TAVR evaluation. She is very frail overall.  Over the last few weeks, she has experienced worsening exertional dyspnea, fatigue and frequent urination at night. She is having orthopnea as well. During these episodes, she becomes extremely anxious and she has experienced some episodes of chest pain and dizziness.    Past Medical History  Diagnosis Date  . Rheumatoid arthritis(714.0)   . Sjogren's syndrome (HCC)   . Hypertension   . Dyslipidemia     Very severe muscle soreness and wekness with use of Lipitor and will not take statin again  . Aortic stenosis     Mild to mod. Echo 5/10 with EF 55-65%, mild to mod AS with mean gradient 19 mmHg, mild LVH. Phsycial exam consistent with mild to mod AS. Initial echo 5/10 was read as "severe aortic stenosis" so cardio became involved. Echo 6/11 mild to mod AS and normal systolic func (no change). Reinterpretation of  echo shows AS still mild to mod  . Pulmonary amyloidosis (HCC) 1992    Localized, dx by lung bx at [facility]; This type of amyloidosis is isolated and should not affect the heart  . Obstructive lung disease (HCC)     Non smoker, 5/10 due to LPA; PFTs 06/03/08 reported mod obstruction w/ severe diffusion defect due to systs/cavitis of amyloidosis vs new asthma vs bronchiolitis obliterans of ra; 2 week pred trial; spirometry  . Chronic rhinitis   . Leg cramps 2010    Unrelated to pulmonary inhalers, tested and proven using Koch's postulate    Past Surgical History  Procedure Laterality Date  . Lung biopsy  1992    [facility]  . Tubal ligation    . Tonsillectomy       Current Outpatient Prescriptions  Medication Sig Dispense Refill  . albuterol (PROAIR HFA) 108 (90 BASE) MCG/ACT inhaler Inhale into the lungs.    Marland Kitchen aspirin 81 MG EC tablet Take 325 mg by mouth daily.     . beta carotene w/minerals (OCUVITE) tablet Take 1 tablet by mouth daily.      . budesonide-formoterol (SYMBICORT) 80-4.5 MCG/ACT inhaler Inhale 2 puffs into the lungs 2 (two) times daily. 1 Inhaler 11  . levothyroxine (SYNTHROID, LEVOTHROID) 50 MCG tablet Take 50 mcg by mouth daily.      . Magnesium (M2 MAGNESIUM) 100 MG CAPS Take 100 mg by mouth daily.    . vitamin B-12 (CYANOCOBALAMIN) 100 MCG tablet Take 100 mcg by mouth daily.      . furosemide (LASIX) 20  MG tablet Take 1 tablet (20 mg total) by mouth daily. 30 tablet 6  . metoprolol succinate (TOPROL XL) 25 MG 24 hr tablet Take 1 tablet (25 mg total) by mouth daily. 30 tablet 6   No current facility-administered medications for this visit.    Allergies:   Atorvastatin; Epinephrine; Ezetimibe; Fluticasone propionate; Fluvastatin; Moxifloxacin; Naproxen sodium; Penicillin v potassium; Penicillins; Prednisone; Quinolones; Sulfa antibiotics; Sulfonamide derivatives; Thimerosal; and Lisinopril    Social History:  The patient  reports that she has never smoked. She has never used  smokeless tobacco. She reports that she does not drink alcohol or use illicit drugs.   Family History:  The patient's family history includes Coronary artery disease in her other; Heart attack in her sister and sister; Heart attack (age of onset: 71) in her brother; Heart attack (age of onset: 79) in her father and mother; Heart attack (age of onset: 78) in her brother.    ROS:  Please see the history of present illness.   Otherwise, review of systems are positive for none.   All other systems are reviewed and negative.    PHYSICAL EXAM: VS:  BP 126/62 mmHg  Pulse 97  Ht 4\' 8"  (1.422 m)  Wt 87 lb (39.463 kg)  BMI 19.52 kg/m2 , BMI Body mass index is 19.52 kg/(m^2). GEN: Well nourished, well developed, in no acute distress HEENT: normal Neck: no JVD, carotid bruits, or masses. There is a 3/6 crescendo decrescendo murmur in the aortic area which is late peaking with mildly diminished S2 Cardiac: RRR; no  rubs, or gallops,no edema  Respiratory:  clear to auscultation bilaterally, normal work of breathing GI: soft, nontender, nondistended, + BS MS: no deformity or atrophy Skin: warm and dry, no rash Neuro:  Strength and sensation are intact Psych: euthymic mood, full affect   EKG:  EKG is ordered today. The ekg ordered today demonstrates normal sinus rhythm, left ventricular hypertrophy with repolarization abnormalities   Recent Labs: No results found for requested labs within last 365 days.    Lipid Panel    Component Value Date/Time   CHOL 152 12/09/2013 0348   TRIG 60 12/09/2013 0348   HDL 55 12/09/2013 0348   VLDL 12 12/09/2013 0348   LDLCALC 85 12/09/2013 0348      Wt Readings from Last 3 Encounters:  06/05/15 87 lb (39.463 kg)  05/26/15 86 lb (39.009 kg)  12/16/14 91 lb 4 oz (41.391 kg)       ASSESSMENT AND PLAN:  1.  Severe symptomatic aortic stenosis: The patient is now showing signs of congestive heart failure which carries a significant poor prognosis. The  patient is not a candidate for aortic valve replacement or TAVR . She is extremely frail and she continues to lose weight. Her weight today is 87 pounds. I had a prolonged discussion with the patient and her daughter about management options. The patient's main concern is to stay home. I have recommended hospice care and they are agreeable to this. We also discussed advanced directives and recommended DO NOT RESUSCITATE status. In terms of her medications, I discontinued hydrochlorothiazide and added furosemide 20 mg once daily. I also added Toprol-XL 25 mg once daily. She is not hypoxic today but she might benefit from oxygen treatment.    Disposition:   FU with me in 1 month  Signed,  12/18/14, MD  06/05/2015 5:53 PM    [facility]

## 2015-06-12 ENCOUNTER — Telehealth: Payer: Self-pay | Admitting: Cardiovascular Disease

## 2015-06-12 DIAGNOSIS — Z79899 Other long term (current) drug therapy: Secondary | ICD-10-CM

## 2015-06-12 NOTE — Telephone Encounter (Signed)
Pt c/o swelling: STAT is pt has developed SOB within 24 hours  1. How long have you been experiencing swelling? The last week, since she started new medication , her "water pill"  2. Where is the swelling located? Both feet and legs   3.  Are you currently taking a "fluid pill"? yes  4.  Are you currently SOB? Has been worse since taking new medication  5.  Have you traveled recently? no

## 2015-06-12 NOTE — Telephone Encounter (Signed)
Spoke w/ pt.  Advised her of Chris's recommendation.  She verbalizes understanding and is agreeable.  Reviewed pt's diet in detail, she eats a lot of fresh vegetables,

## 2015-06-12 NOTE — Telephone Encounter (Signed)
Dr. Kirke Corin was concerned that she was developing worsening fluid overload when he saw her on 5/19, which is why he changed her to lasix 20 mg, which is a stronger diuretic than HCTZ. As she is now retaining fluid, she may resume HCTZ in addition to lasix but should also have a f/u bmet and office visit early next week (she sees pulmonary 5/31 - can she see [place] the same day?).

## 2015-06-12 NOTE — Telephone Encounter (Signed)
Spoke w/ pt.  On 06/05/15, she was advised:  1. Discontinue HCTZ 2.Toprol 25 mg Once Daily 3. Lasix 20 mg Once Daily  She reports that since this change, she has developed foot, ankle, and calf swelling. She states that she "eats the same things I've been eating", drinks "all day, but only a swallow at a time". Pt does not weigh herself daily as "I lost a lot of wt, my scales broke and I haven't bought a new one." Pt states that she was on HCTZ for 2-3 years w/ no problems, would like to know if she can switch back because "this lasix is drowning me." Advised her that Dr. Kirke Corin is out of the office today, that I will forward to Ward Givens, NP and call her back w/ his recommendation.

## 2015-06-16 ENCOUNTER — Ambulatory Visit: Payer: Medicare Other | Admitting: Cardiovascular Disease

## 2015-06-17 ENCOUNTER — Encounter: Payer: Self-pay | Admitting: Pulmonary Disease

## 2015-06-17 ENCOUNTER — Ambulatory Visit (INDEPENDENT_AMBULATORY_CARE_PROVIDER_SITE_OTHER): Payer: Medicare Other | Admitting: Pulmonary Disease

## 2015-06-17 VITALS — BP 110/74 | HR 90 | Ht <= 58 in | Wt 85.0 lb

## 2015-06-17 DIAGNOSIS — J449 Chronic obstructive pulmonary disease, unspecified: Secondary | ICD-10-CM | POA: Diagnosis not present

## 2015-06-17 DIAGNOSIS — E854 Organ-limited amyloidosis: Secondary | ICD-10-CM

## 2015-06-17 DIAGNOSIS — E858 Other amyloidosis: Secondary | ICD-10-CM | POA: Diagnosis not present

## 2015-06-17 DIAGNOSIS — J99 Respiratory disorders in diseases classified elsewhere: Principal | ICD-10-CM

## 2015-06-19 ENCOUNTER — Encounter: Payer: Self-pay | Admitting: Cardiovascular Disease

## 2015-06-19 ENCOUNTER — Ambulatory Visit (INDEPENDENT_AMBULATORY_CARE_PROVIDER_SITE_OTHER): Payer: Medicare Other | Admitting: Cardiovascular Disease

## 2015-06-19 ENCOUNTER — Telehealth: Payer: Self-pay | Admitting: Cardiovascular Disease

## 2015-06-19 ENCOUNTER — Other Ambulatory Visit: Payer: Medicare Other

## 2015-06-19 VITALS — BP 108/62 | HR 91 | Ht <= 58 in | Wt 85.5 lb

## 2015-06-19 DIAGNOSIS — I350 Nonrheumatic aortic (valve) stenosis: Secondary | ICD-10-CM | POA: Diagnosis not present

## 2015-06-19 MED ORDER — FUROSEMIDE 20 MG PO TABS: ORAL_TABLET | ORAL | Status: AC

## 2015-06-19 MED ORDER — FUROSEMIDE 20 MG PO TABS: 20.0000 mg | ORAL_TABLET | Freq: Every day | ORAL | Status: DC

## 2015-06-19 MED ORDER — HYDROCHLOROTHIAZIDE 25 MG PO TABS: 25.0000 mg | ORAL_TABLET | Freq: Every day | ORAL | Status: AC

## 2015-06-19 NOTE — Progress Notes (Signed)
Cardiology Office Note   Date:  06/19/2015   ID:  Denise Bell, DOB 1919/10/11, MRN [id number]  PCP:  [name]., MD  Cardiologist:   Lorine Bears, MD   Chief Complaint  Patient presents with  . other    C/o edema legs/feet couple days ago x1 wk. Meds reviewed verbally with pt.      History of Present Illness: Denise Bell is a 82 y.o. female who presents for a follow-up visit regarding severe aortic stenosis. She has known history of rheumatoid arthritis, localized pulmonary amyloidosis, Sjogren's syndrome, obstructive lung disease, and aortic stenosis.  Echocardiogram in 2011 showed mild to moderate aortic stenosis with normal ejection fraction. She was seen in 05/2013 for worsening dyspnea. She was treated for COPD. An echocardiogram was done which showed normal LV systolic function, moderate left ventricular hypertrophy, severely calcified aortic valve with severe stenosis, calcified mitral annulus with moderate to severe mitral regurgitation. Mean gradient across the aortic valve was 42 mm Hg with a valve area of 0.63 cm.  After extensive discussion with the patient and family, they elected against TAVR evaluation. She is very frail overall.  She was seen recently for worsening dyspnea, orthopnea and leg edema. She was thought to have heart failure exacerbation in the setting of severe aortic stenosis. I switched hydrochlorothiazide to furosemide 20 mg once daily and added small dose Toprol 25 mg once daily. I recommended hospice care which was done. She had worsening symptoms after making the changes in her medications with worsening leg edema and dyspnea. She went back on hydrochlorothiazide and stopped taking metoprolol. She reports improvement in symptoms. Leg edema has improved significantly.    Past Medical History  Diagnosis Date  . Rheumatoid arthritis(714.0)   . Sjogren's syndrome (HCC)   . Hypertension   . Dyslipidemia     Very severe muscle  soreness and wekness with use of Lipitor and will not take statin again  . Aortic stenosis     Mild to mod. Echo 5/10 with EF 55-65%, mild to mod AS with mean gradient 19 mmHg, mild LVH. Phsycial exam consistent with mild to mod AS. Initial echo 5/10 was read as "severe aortic stenosis" so cardio became involved. Echo 6/11 mild to mod AS and normal systolic func (no change). Reinterpretation of echo shows AS still mild to mod  . Pulmonary amyloidosis (HCC) 1992    Localized, dx by lung bx at [facility]; This type of amyloidosis is isolated and should not affect the heart  . Obstructive lung disease (HCC)     Non smoker, 5/10 due to LPA; PFTs 06/03/08 reported mod obstruction w/ severe diffusion defect due to systs/cavitis of amyloidosis vs new asthma vs bronchiolitis obliterans of ra; 2 week pred trial; spirometry  . Chronic rhinitis   . Leg cramps 2010    Unrelated to pulmonary inhalers, tested and proven using Koch's postulate    Past Surgical History  Procedure Laterality Date  . Lung biopsy  1992    [facility]  . Tubal ligation    . Tonsillectomy       Current Outpatient Prescriptions  Medication Sig Dispense Refill  . albuterol (PROAIR HFA) 108 (90 BASE) MCG/ACT inhaler Inhale into the lungs.    Marland Kitchen aspirin 81 MG EC tablet Take 325 mg by mouth daily.     . beta carotene w/minerals (OCUVITE) tablet Take 1 tablet by mouth daily.      . budesonide-formoterol (SYMBICORT) 80-4.5 MCG/ACT inhaler Inhale 2 puffs  into the lungs 2 (two) times daily. 1 Inhaler 11  . furosemide (LASIX) 20 MG tablet Take 1 tablet (20 mg total) by mouth daily. 30 tablet 6  . levothyroxine (SYNTHROID, LEVOTHROID) 50 MCG tablet Take 50 mcg by mouth daily.      . Magnesium (M2 MAGNESIUM) 100 MG CAPS Take 100 mg by mouth daily.    . metoprolol succinate (TOPROL XL) 25 MG 24 hr tablet Take 1 tablet (25 mg total) by mouth daily. 30 tablet 6  . vitamin B-12 (CYANOCOBALAMIN) 100 MCG tablet Take 100 mcg by mouth daily.       No  current facility-administered medications for this visit.    Allergies:   Atorvastatin; Epinephrine; Ezetimibe; Fluticasone propionate; Fluvastatin; Moxifloxacin; Naproxen sodium; Penicillin v potassium; Penicillins; Prednisone; Quinolones; Sulfa antibiotics; Sulfonamide derivatives; Thimerosal; and Lisinopril    Social History:  The patient  reports that she has never smoked. She has never used smokeless tobacco. She reports that she does not drink alcohol or use illicit drugs.   Family History:  The patient's family history includes Coronary artery disease in her other; Heart attack in her sister and sister; Heart attack (age of onset: 59) in her brother; Heart attack (age of onset: 63) in her father and mother; Heart attack (age of onset: 58) in her brother.    ROS:  Please see the history of present illness.   Otherwise, review of systems are positive for none.   All other systems are reviewed and negative.    PHYSICAL EXAM: VS:  BP 108/62 mmHg  Pulse 91  Ht 4\' 7"  (1.397 m)  Wt 85 lb 8 oz (38.783 kg)  BMI 19.87 kg/m2 , BMI Body mass index is 19.87 kg/(m^2). GEN: Well nourished, well developed, in no acute distress HEENT: normal Neck: no JVD, carotid bruits, or masses. There is a 3/6 crescendo decrescendo murmur in the aortic area which is late peaking with mildly diminished S2 Cardiac: RRR; no  rubs, or gallops,no edema  Respiratory:  clear to auscultation bilaterally, normal work of breathing GI: soft, nontender, nondistended, + BS MS: no deformity or atrophy Skin: warm and dry, no rash Neuro:  Strength and sensation are intact Psych: euthymic mood, full affect   EKG:  EKG is ordered today. The ekg ordered today demonstrates normal sinus rhythm, left ventricular hypertrophy with repolarization abnormalities   Recent Labs: No results found for requested labs within last 365 days.    Lipid Panel    Component Value Date/Time   CHOL 152 12/09/2013 0348   TRIG 60  12/09/2013 0348   HDL 55 12/09/2013 0348   VLDL 12 12/09/2013 0348   LDLCALC 85 12/09/2013 0348      Wt Readings from Last 3 Encounters:  06/19/15 85 lb 8 oz (38.783 kg)  06/17/15 85 lb (38.556 kg)  06/05/15 87 lb (39.463 kg)       ASSESSMENT AND PLAN:  1.  Severe symptomatic aortic stenosis:  The patient is not a candidate for aortic valve replacement or TAVR . Continue hospice care. She does have home oxygen now which she uses as needed.  She had worsening symptoms with metoprolol and thus this was discontinued. She is currently on 2 diuretics including small dose furosemide and hydrochlorothiazide. I instructed her to use furosemide only 2 times per week to avoid volume depletion. She has a follow-up appointment scheduled with Dr. 06/07/15 in the next 2 weeks with labs. I recommend checking basic metabolic profile.    Disposition:  FU with me in 3  months  Signed,  Lorine Bears, MD  06/19/2015 2:54 PM    [facility]

## 2015-06-19 NOTE — Telephone Encounter (Signed)
Pharmacy calling asking about prescription we just sent in We sent in for Furosamide  On it we have two instructions  1. Take 1 20 mg tablet daily. 2. Take tablet Tuesday and thursday only  Just need to clarify that Please call back.

## 2015-06-19 NOTE — Patient Instructions (Addendum)
Medication Instructions:  Your physician has recommended you make the following change in your medication:  CHANGE lasix dosage to 20mg  on Mondays and Thursdays RESUME Hctz 25mg  daily STOP taking metoprolol  Labwork: none  Testing/Procedures: none  Follow-Up: Your physician recommends that you schedule a follow-up appointment in: 3 months with Dr. 06-19-1979.    Any Other Special Instructions Will Be Listed Below (If Applicable).     If you need a refill on your cardiac medications before your next appointment, please call your pharmacy.

## 2015-06-20 NOTE — Progress Notes (Signed)
82 y.o. F previously followed by Dr Kendrick Fries for COPD and pulmonary nodules proven by biopsy to be pulmonary amyloidosis.   SUBJ: This is a routine F/U. She is gradually declining in her exercvise toerance but her mind remains sharp. She continues to use Symbicort BD and also albuterol MDI at breakfast and lunch. This is a regimen that has worked well for her for years. She denies CP, cough, sputum, hemoptysis, LE edema and calf tendetness. She has had no fevers or constitutional symptoms. She has recently been enrolled in Hospice  Filed Vitals:   06/17/15 1458  BP: 110/74  Pulse: 90  Height: 4\' 7"  (1.397 m)  Weight: 85 lb (38.556 kg)  SpO2: 97%   NAD  HEENT WNL NO JVD No adventitious sounds RRR s M NABS, soft No C/C/E  I have reviewed prior CXRs and CT chest  IMPRESSION: Pulmonary amyloidosis - minimal progression over many years COPD - well controlled Advanced age with declining functional status - now on Hospice  PLAN/REC: Cont current regimen of Symbicort and albuterol as above F/U in 6 months or sooner PRN  , MD PCCM service Mobile [phone] Pager [phone]

## 2015-07-06 ENCOUNTER — Telehealth: Payer: Self-pay | Admitting: Pulmonary Disease

## 2015-07-06 MED ORDER — ALBUTEROL SULFATE (2.5 MG/3ML) 0.083% IN NEBU: 2.5000 mg | INHALATION_SOLUTION | RESPIRATORY_TRACT | Status: AC | PRN

## 2015-07-06 NOTE — Telephone Encounter (Signed)
Per DK, Albuterol Q4hrs PRN. Called and spoke with Juliette Alcide who transferred me to Vanessa's VM. LMOM that we were sending Albuterol Neb solution to use Q4hrs PRN and it was sent to [facility]. Nothing further needed

## 2015-07-06 NOTE — Telephone Encounter (Signed)
Nurse from home health calling stating pt has some wheezing going on. She saw her this morning and saw this  but patient stated to her that this started yesterday Lungs are clear  Would like to know if we can send in for a nebulizer for patient  Please advise

## 2015-07-06 NOTE — Telephone Encounter (Signed)
Please advise on neb medication. Last seen by DS on 06/17/15. Thanks

## 2015-07-16 ENCOUNTER — Telehealth: Payer: Self-pay | Admitting: *Deleted

## 2015-07-16 NOTE — Telephone Encounter (Signed)
Received fax from insurance stating there is no PA required. Informed pharmacy. Nothing further needed.

## 2015-07-16 NOTE — Telephone Encounter (Signed)
Initiated PA for Symbicort thru [facility]. Key: [id number]  Will await response.  [facility] - [place], [place] - [address] [phone] (Phone) [phone] (Fax)

## 2015-07-24 ENCOUNTER — Ambulatory Visit: Payer: Medicare Other | Admitting: Cardiovascular Disease

## 2015-07-25 ENCOUNTER — Emergency Department
Admission: EM | Admit: 2015-07-25 | Discharge: 2015-07-25 | Disposition: A | Discharge status: Home / Self Care | Payer: Medicare Other | Attending: Emergency Medicine | Admitting: Emergency Medicine

## 2015-07-25 ENCOUNTER — Encounter: Payer: Self-pay | Admitting: Emergency Medicine

## 2015-07-25 ENCOUNTER — Emergency Department: Payer: Medicare Other

## 2015-07-25 DIAGNOSIS — Y939 Activity, unspecified: Secondary | ICD-10-CM | POA: Diagnosis not present

## 2015-07-25 DIAGNOSIS — M3500 Sicca syndrome, unspecified: Secondary | ICD-10-CM | POA: Insufficient documentation

## 2015-07-25 DIAGNOSIS — I10 Essential (primary) hypertension: Secondary | ICD-10-CM | POA: Insufficient documentation

## 2015-07-25 DIAGNOSIS — Z79899 Other long term (current) drug therapy: Secondary | ICD-10-CM | POA: Diagnosis not present

## 2015-07-25 DIAGNOSIS — M069 Rheumatoid arthritis, unspecified: Secondary | ICD-10-CM | POA: Insufficient documentation

## 2015-07-25 DIAGNOSIS — Y999 Unspecified external cause status: Secondary | ICD-10-CM | POA: Insufficient documentation

## 2015-07-25 DIAGNOSIS — S92512A Displaced fracture of proximal phalanx of left lesser toe(s), initial encounter for closed fracture: Secondary | ICD-10-CM | POA: Insufficient documentation

## 2015-07-25 DIAGNOSIS — E785 Hyperlipidemia, unspecified: Secondary | ICD-10-CM | POA: Diagnosis not present

## 2015-07-25 DIAGNOSIS — I350 Nonrheumatic aortic (valve) stenosis: Secondary | ICD-10-CM | POA: Insufficient documentation

## 2015-07-25 DIAGNOSIS — Y929 Unspecified place or not applicable: Secondary | ICD-10-CM | POA: Diagnosis not present

## 2015-07-25 DIAGNOSIS — E858 Other amyloidosis: Secondary | ICD-10-CM | POA: Insufficient documentation

## 2015-07-25 DIAGNOSIS — Z7982 Long term (current) use of aspirin: Secondary | ICD-10-CM | POA: Insufficient documentation

## 2015-07-25 DIAGNOSIS — J449 Chronic obstructive pulmonary disease, unspecified: Secondary | ICD-10-CM | POA: Diagnosis not present

## 2015-07-25 DIAGNOSIS — S92502A Displaced unspecified fracture of left lesser toe(s), initial encounter for closed fracture: Secondary | ICD-10-CM

## 2015-07-25 DIAGNOSIS — W228XXA Striking against or struck by other objects, initial encounter: Secondary | ICD-10-CM | POA: Insufficient documentation

## 2015-07-25 DIAGNOSIS — L819 Disorder of pigmentation, unspecified: Secondary | ICD-10-CM | POA: Diagnosis present

## 2015-07-25 MED ORDER — ALBUTEROL SULFATE (2.5 MG/3ML) 0.083% IN NEBU
2.5000 mg | INHALATION_SOLUTION | Freq: Once | RESPIRATORY_TRACT | Status: DC
  Filled 2015-07-25: qty 3

## 2015-07-25 NOTE — ED Notes (Signed)
Noticed toe/foot bruising 2 hrs ago. Bruising across the top of foot. Denies injury. On 325mg  asa daily

## 2015-07-25 NOTE — ED Notes (Signed)
Report called to [place] from [facility].

## 2015-07-25 NOTE — Discharge Instructions (Signed)
Your left 5th toe (smallest toe) has a broken bone.  Keep the toe taped to the next toe to keep it stabilized and protected.  Follow up with your primary care doctor.  Toe Fracture A toe fracture is a break in one of the toe bones (phalanges). CAUSES This condition may be caused by:  Dropping a heavy object on your toe.  Stubbing your toe.  Overusing your toe or doing repetitive exercise.  Twisting or stretching your toe out of place. RISK FACTORS This condition is more likely to develop in people who:  Play contact sports.  Have a bone disease.  Have a low calcium level. SYMPTOMS The main symptoms of this condition are swelling and pain in the toe. The pain may get worse with standing or walking. Other symptoms include:  Bruising.  Stiffness.  Numbness.  A change in the way the toe looks.  Broken bones that poke through the skin.  Blood beneath the toenail. DIAGNOSIS This condition is diagnosed with a physical exam. You may also have X-rays. TREATMENT  Treatment for this condition depends on the type of fracture and its severity. Treatment may involve:  Taping the broken toe to a toe that is next to it (buddy taping). This is the most common treatment for fractures in which the bone has not moved out of place (nondisplaced fracture).  Wearing a shoe that has a wide, rigid sole to protect the toe and to limit its movement.  Wearing a walking cast.  Having a procedure to move the toe back into place.  Surgery. This may be needed:  If there are many pieces of broken bone that are out of place (displaced).  If the toe joint breaks.  If the bone breaks through the skin.  Physical therapy. This is done to help regain movement and strength in the toe. You may need follow-up X-rays to make sure that the bone is healing well and staying in position. HOME CARE INSTRUCTIONS If You Have a Cast:  Do not stick anything inside the cast to scratch your skin. Doing that  increases your risk of infection.  Check the skin around the cast every day. Report any concerns to your health care provider. You may put lotion on dry skin around the edges of the cast. Do not apply lotion to the skin underneath the cast.  Do not put pressure on any part of the cast until it is fully hardened. This may take several hours.  Keep the cast clean and dry. Bathing  Do not take baths, swim, or use a hot tub until your health care provider approves. Ask your health care provider if you can take showers. You may only be allowed to take sponge baths for bathing.  If your health care provider approves bathing and showering, cover the cast or bandage (dressing) with a watertight plastic bag to protect it from water. Do not let the cast or dressing get wet. Managing Pain, Stiffness, and Swelling  If you do not have a cast, apply ice to the injured area, if directed.  Put ice in a plastic bag.  Place a towel between your skin and the bag.  Leave the ice on for 20 minutes, 2-3 times per day.  Move your toes often to avoid stiffness and to lessen swelling.  Raise (elevate) the injured area above the level of your heart while you are sitting or lying down. Driving  Do not drive or operate heavy machinery while taking pain medicine.  Do not drive while wearing a cast on a foot that you use for driving. Activity  Return to your normal activities as directed by your health care provider. Ask your health care provider what activities are safe for you.  Perform exercises daily as directed by your health care provider or physical therapist. Safety  Do not use the injured limb to support your body weight until your health care provider says that you can. Use crutches or other assistive devices as directed by your health care provider. General Instructions  If your toe was treated with buddy taping, follow your health care provider's instructions for changing the gauze and tape.  Change it more often:  The gauze and tape get wet. If this happens, dry the space between the toes.  The gauze and tape are too tight and cause your toe to become pale or numb.  Wear a protective shoe as directed by your health care provider. If you were not given a protective shoe, wear sturdy, supportive shoes. Your shoes should not pinch your toes and should not fit tightly against your toes.  Do not use any tobacco products, including cigarettes, chewing tobacco, or e-cigarettes. Tobacco can delay bone healing. If you need help quitting, ask your health care provider.  Take medicines only as directed by your health care provider.  Keep all follow-up visits as directed by your health care provider. This is important. SEEK MEDICAL CARE IF:  You have a fever.  Your pain medicine is not helping.  Your toe is cold.  Your toe is numb.  You still have pain after one week of rest and treatment.  You still have pain after your health care provider has said that you can start walking again.  You have pain, tingling, or numbness in your foot that is not going away. SEEK IMMEDIATE MEDICAL CARE IF:  You have severe pain.  You have redness or inflammation in your toe that is getting worse.  You have pain or numbness in your toe that is getting worse.  Your toe turns blue.   This information is not intended to replace advice given to you by your health care provider. Make sure you discuss any questions you have with your health care provider.   Document Released: 01/01/2000 Document Revised: 09/24/2014 Document Reviewed: 10/30/2013 Elsevier Interactive Patient Education [employer]

## 2015-07-25 NOTE — ED Provider Notes (Signed)
[facility] Emergency Department Provider Note  ____________________________________________  Time seen: 2:05 PM  I have reviewed the triage vital signs and the nursing notes.   HISTORY  Chief Complaint Bleeding/Bruising    HPI Denise Bell is a 82 y.o. female comes the ED complaining of discoloration and bruising of her left fifth toe and forefoot. Noticed this just a few hours ago today. She denies any trauma, hasn't hit her feet on anything, but also has neuropathy and numbness of the foot. She also walks around barefoot frequently. Denies any prior vascular problems such as arterial or venous occlusion. He is in hospice for aortic stenosis which is critical but not able to be treated. She denies chest pain shortness of breath exertional symptoms or orthostatic symptoms.    Past Medical History  Diagnosis Date  . Rheumatoid arthritis(714.0)   . Sjogren's syndrome (HCC)   . Hypertension   . Dyslipidemia     Very severe muscle soreness and wekness with use of Lipitor and will not take statin again  . Aortic stenosis     Mild to mod. Echo 5/10 with EF 55-65%, mild to mod AS with mean gradient 19 mmHg, mild LVH. Phsycial exam consistent with mild to mod AS. Initial echo 5/10 was read as "severe aortic stenosis" so cardio became involved. Echo 6/11 mild to mod AS and normal systolic func (no change). Reinterpretation of echo shows AS still mild to mod  . Pulmonary amyloidosis (HCC) 1992    Localized, dx by lung bx at [facility]; This type of amyloidosis is isolated and should not affect the heart  . Obstructive lung disease (HCC)     Non smoker, 5/10 due to LPA; PFTs 06/03/08 reported mod obstruction w/ severe diffusion defect due to systs/cavitis of amyloidosis vs new asthma vs bronchiolitis obliterans of ra; 2 week pred trial; spirometry  . Chronic rhinitis   . Leg cramps 2010    Unrelated to pulmonary inhalers, tested and proven using Koch's postulate      Patient Active Problem List   Diagnosis Date Noted  . Upper respiratory tract infection 12/05/2013  . CRAMP IN LIMB 07/28/2008  . Aortic stenosis 06/25/2008  . HYPERLIPIDEMIA 06/06/2008  . Amyloidosis of lung (HCC) 06/06/2008  . Essential hypertension 06/06/2008  . ALLERGIC RHINITIS 06/06/2008  . COPD 06/06/2008  . DYSPNEA 06/06/2008     Past Surgical History  Procedure Laterality Date  . Lung biopsy  1992    [facility]  . Tubal ligation    . Tonsillectomy       Current Outpatient Rx  Name  Route  Sig  Dispense  Refill  . albuterol (PROAIR HFA) 108 (90 BASE) MCG/ACT inhaler   Inhalation   Inhale into the lungs.         Marland Kitchen albuterol (PROVENTIL) (2.5 MG/3ML) 0.083% nebulizer solution   Nebulization   Take 3 mLs (2.5 mg total) by nebulization every 4 (four) hours as needed for wheezing or shortness of breath.   75 mL   12   . aspirin 81 MG EC tablet   Oral   Take 325 mg by mouth daily.          . beta carotene w/minerals (OCUVITE) tablet   Oral   Take 1 tablet by mouth daily.           . budesonide-formoterol (SYMBICORT) 80-4.5 MCG/ACT inhaler   Inhalation   Inhale 2 puffs into the lungs 2 (two) times daily.   1 Inhaler  11   . furosemide (LASIX) 20 MG tablet      Take 20mg  (one tablet) on Mondays and Thursdays.   30 tablet   3   . hydrochlorothiazide (HYDRODIURIL) 25 MG tablet   Oral   Take 1 tablet (25 mg total) by mouth daily.   30 tablet   3   . levothyroxine (SYNTHROID, LEVOTHROID) 50 MCG tablet   Oral   Take 50 mcg by mouth daily.           . Magnesium (M2 MAGNESIUM) 100 MG CAPS   Oral   Take 100 mg by mouth daily.         . vitamin B-12 (CYANOCOBALAMIN) 100 MCG tablet   Oral   Take 100 mcg by mouth daily.              Allergies Atorvastatin; Epinephrine; Ezetimibe; Fluticasone propionate; Fluvastatin; Moxifloxacin; Naproxen sodium; Penicillin v potassium; Penicillins; Prednisone; Quinolones; Sulfa antibiotics; Sulfonamide  derivatives; Thimerosal; and Lisinopril   Family History  Problem Relation Age of Onset  . Heart attack Mother 49  . Heart attack Father 11  . Heart attack Sister   . Heart attack Brother 42  . Coronary artery disease Other   . Heart attack Brother 81  . Heart attack Sister     Social History Social History  Substance Use Topics  . Smoking status: Never Smoker   . Smokeless tobacco: Never Used  . Alcohol Use: No    Review of Systems  Constitutional:   No fever or chills.  ENT:   No sore throat. No rhinorrhea. Cardiovascular:   No chest pain. Respiratory:   No dyspnea or cough. Gastrointestinal:   Negative for abdominal pain, vomiting and diarrhea.  Genitourinary:   Negative for dysuria or difficulty urinating. Musculoskeletal:   Left foot swelling Neurological:   Negative for headaches 10-point ROS otherwise negative.  ____________________________________________   PHYSICAL EXAM:  VITAL SIGNS: ED Triage Vitals  Enc Vitals Group     BP 07/25/15 1317 108/80 mmHg     Pulse Rate 07/25/15 1317 75     Resp 07/25/15 1317 20     Temp 07/25/15 1317 98.2 F (36.8 C)     Temp Source 07/25/15 1317 Oral     SpO2 07/25/15 1317 100 %     Weight 07/25/15 1317 96 lb (43.545 kg)     Height 07/25/15 1317 4\' 8"  (1.422 m)     Head Cir --      Peak Flow --      Pain Score --      Pain Loc --      Pain Edu? --      Excl. in GC? --     Vital signs reviewed, nursing assessments reviewed.   Constitutional:   Alert and oriented. Well appearing and in no distress. Eyes:   No scleral icterus. No conjunctival pallor. PERRL. EOMI.  No nystagmus. ENT   Head:   Normocephalic and atraumatic.      Mouth/Throat:   MMM, no pharyngeal erythema.    Hematological/Lymphatic/Immunilogical:   No cervical lymphadenopathy. Cardiovascular:   RRR. Symmetric bilateral radial and DP pulses. DP pulses are 1+ bilaterally. Strong Doppler signal and DP. Sluggish capillary refill in all  toes, affected toe on the left fifth is similar to the others with capillary Refill of about 4 seconds Respiratory:   Normal respiratory effort without tachypnea nor retractions. Breath sounds are clear and equal bilaterally. No wheezes/rales/rhonchi.Chronic home oxygen.  Gastrointestinal:  Soft and nontender. Non distended.  No rebound, rigidity, or guarding. Musculoskeletal:   Nontender with normal range of motion in all extremities. No joint effusions.  No lower extremity tenderness.  No edema. No bony point tenderness. No deformity. Compartments soft, no soft tissue edema or erythema. Neurologic:   Normal speech and language.  CN 2-10 normal. Motor grossly intact. No gross focal neurologic deficits are appreciated.  Skin:    Skin is warm, dry and intact. There is ecchymosis at the base of the left fifth toe and extending across the left plantar forefoot. No bony instability.  ____________________________________________    LABS (pertinent positives/negatives) (all labs ordered are listed, but only abnormal results are displayed) Labs Reviewed - No data to display ____________________________________________   EKG    ____________________________________________    RADIOLOGY  Ultrasound left lower extremity unremarkable X-ray left foot shows fracture of left fifth toe  ____________________________________________   PROCEDURES   ____________________________________________   INITIAL IMPRESSION / ASSESSMENT AND PLAN / ED COURSE  Pertinent labs & imaging results that were available during my care of the patient were reviewed by me and considered in my medical decision making (see chart for details).  Patient presents with discoloration of the left fifth toe. Found to have a toe fracture. Unable to identify the causative injury. Toes buddy taped. Otherwise no acute distress, in her usual state of health. Due to neuropathy, patient has no pain. Follow-up with primary care.  Initial fracture management provided, which in this case consists of buddy taping the toes and primary care follow-up.     ____________________________________________   FINAL CLINICAL IMPRESSION(S) / ED DIAGNOSES  Final diagnoses:  Fracture of fifth toe, left, closed, initial encounter       Portions of this note were generated with dragon dictation software. Dictation errors may occur despite best attempts at proofreading.   Sharman Cheek, MD 07/25/15 [phone]

## 2015-07-25 NOTE — ED Notes (Signed)
Patient was assisted to bedpan. Patient tolerated bedpan well. Family at bedside.

## 2015-07-25 NOTE — ED Notes (Signed)
Denise Bell from [facility] is at bedside and her contact [phone], cell [phone] for disposition.

## 2015-08-18 DEATH — deceased

## 2015-09-22 ENCOUNTER — Ambulatory Visit: Payer: Medicare Other | Admitting: Cardiovascular Disease
